# Patient Record
Sex: Female | Born: 1985 | Race: Black or African American | Hispanic: No | Marital: Single | Smoking: Never smoker
Health system: Southern US, Community
[De-identification: ages and names within clinical notes are randomized; demographics above are authoritative.]

## PROBLEM LIST (undated history)

## (undated) DIAGNOSIS — R7303 Prediabetes: Secondary | ICD-10-CM

## (undated) DIAGNOSIS — F419 Anxiety disorder, unspecified: Secondary | ICD-10-CM

## (undated) DIAGNOSIS — F319 Bipolar disorder, unspecified: Secondary | ICD-10-CM

## (undated) HISTORY — DX: Prediabetes: R73.03

---
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
## 2014-04-26 ENCOUNTER — Encounter: Payer: Federal, State, Local not specified - PPO | Attending: Physician Assistant | Admitting: Dietician

## 2014-04-26 ENCOUNTER — Encounter: Payer: Self-pay | Admitting: Dietician

## 2014-04-26 DIAGNOSIS — Z6841 Body Mass Index (BMI) 40.0 and over, adult: Secondary | ICD-10-CM | POA: Diagnosis not present

## 2014-04-26 NOTE — Progress Notes (Signed)
  Medical Nutrition Therapy:  Appt start time: 0800 end time:  0845.   Assessment:  Primary concerns today: Hayley Romero is here today she is on medication for bipolar disorder. Sees a psychiatrist and recently moved to [place]. Has gained 70 lbs since 2010 since the medications caused her to gain weight. Also has elevated blood sugar (Hgb A1c of 6.1-6.2% per patient). Got a trainer in December in 2015 and has lost about 7 lbs since then. Started cooking more and eating out less though still has cravings for sugar and salt, though tries to not keep it at home. Also craves bread.  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 Does not skip meals though "eats all day long". Not sure if she is eating because she is hungry. Eats out about 3-6 x week.   Using MyFitness Pal.  Feels like she might be eating more than she needs but she is hungry. Is a fast eater. Eats meals at her table at home. Feels tired mid morning but she is not sure why.  Preferred Learning Style:   No preference indicated   Learning Readiness:   Ready  MEDICATIONS: see list    DIETARY INTAKE:  Usual eating pattern includes 3 meals and 2-3 snacks per day.  Avoided foods include a lot of vegetables  24-hr recall:  B ( AM): grits with cheese, bacon/sausage, eggs with applesauce or oatmeal with eggs with 1 cup orange juice Snk ( AM): granola and yogurt L ( PM): pasta casserole with spinach and tomato sauce  Snk ( PM): [employer] peanut butter bar or oatmeal  D ( PM): homemade pizza (small) or hot dog or fajita (chicken and peppers and onions) Snk ( PM): popcorn Beverages: water, 2 cups juice at home  Usual physical activity: 1-3 x week 30-60 minutes with trainer cardio and weights  Estimated energy needs: 2000 calories 225 g carbohydrates 150 g protein 56 g fat  Progress Towards Goal(s):  In progress.   Nutritional Diagnosis:  [place]-3.3 Overweight/obesity As related to hx of large portion sizes and frequent  snacking.  As evidenced by BMI of 40.8.    Intervention:  Nutrition counseling provided. Plan: Add vegetable to lunch and dinner. Drink water instead of orange juice in the morning. Have protein with carbs for snacks (see list). Don't eat and work at the same time. If you hungry, take a break. Use small plates to help with portion sizes.  Take 20 minutes to eat meals. Chew 20-30 times per bite. Put your fork down. If you are still hungry after 20 minutes, have seconds.  Continue exercising about 3 x week.  Teaching Method Utilized:  Visual Auditory Hands on  Handouts given during visit include:  MyPlate Handout  Meal card  15 g CHO Snacks  Barriers to learning/adherence to lifestyle change: medications increase hunger  Demonstrated degree of understanding via:  Teach Back   Monitoring/Evaluation:  Dietary intake, exercise, and body weight prn.

## 2014-04-26 NOTE — Patient Instructions (Signed)
Add vegetable to lunch and dinner. Drink water instead of orange juice in the morning. Have protein with carbs for snacks (see list). Don't eat and work at the same time. If you hungry, take a break. Use small plates to help with portion sizes.  Take 20 minutes to eat meals. Chew 20-30 times per bite. Put your fork down. If you are still hungry after 20 minutes, have seconds.  Continue exercising about 3 x week.

## 2014-08-28 ENCOUNTER — Encounter: Payer: Self-pay | Admitting: Dietician

## 2014-08-28 ENCOUNTER — Encounter: Payer: Federal, State, Local not specified - PPO | Attending: Physician Assistant | Admitting: Dietician

## 2014-08-28 DIAGNOSIS — Z713 Dietary counseling and surveillance: Secondary | ICD-10-CM | POA: Insufficient documentation

## 2014-08-28 DIAGNOSIS — Z6841 Body Mass Index (BMI) 40.0 and over, adult: Secondary | ICD-10-CM | POA: Insufficient documentation

## 2014-08-28 NOTE — Patient Instructions (Addendum)
Aim to fill half of your plate with vegetables for lunch and dinner. Have protein with carbs for snacks (see list). Don't eat and work at the same time when you can.  Use small plates to help with portion sizes.  Plan to walk the track about 3 x week. Make sure to drink at least 64 oz of fluid in each day.  Portion out pecans (size of palm of your hand) to have with snacks.  Have [facility] Protein bar when you are craving sweets.  Try not to have tempting food around the house.  Do meal planning on weekend.

## 2014-08-28 NOTE — Progress Notes (Signed)
  Medical Nutrition Therapy:  Appt start time: 0415 end time:  0445.   Assessment:  Primary concerns today: Hayley Romero returns with a 5 lb weight gain. Had an allergic reaction to Topamax and regained some weight and has had more cravings since going on off that. Stopped trying to eat healthy after that. Had another Hgb A1c test and not sure of the results. No longer working out with trainer since it was too expensive. Still cooking a lot at home. Not craving bread as much but craves sugar. Still using MyFitness Pal.  No longer drinking juice for the past month.     Wt Readings from Last 3 Encounters:  08/28/14 213 lb 3.2 oz (96.707 kg)  04/26/14 208 lb 12.8 oz (94.711 kg)   Ht Readings from Last 3 Encounters:  08/28/14 5' (1.524 m)  04/26/14 5' (1.524 m)   Body mass index is 41.64 kg/(m^2). @BMIFA @ Normalized weight-for-age data available only for age 56 to 20 years. Normalized stature-for-age data available only for age 56 to 20 years.   Preferred Learning Style:   No preference indicated   Learning Readiness:   Ready  MEDICATIONS: see list    DIETARY INTAKE:  Usual eating pattern includes 3 meals and 2-3 snacks per day.  Avoided foods include a lot of vegetables  24-hr recall:  B ( AM): meat, eggs, and maybe bread Snk ( AM): pecans and yogurt L ( PM): pasta casserole with spinach and tomato sauce or chicken and spaghetti and applesauce Snk ( PM): chips  D ( PM): frozen flounder sandwich or hot dog  Snk ( PM): pecans with grapes, pineapple, and yogurt Beverages: water, herbal green tea with oranges/pineapples   Usual physical activity: none  Estimated energy needs: 2000 calories 225 g carbohydrates 150 g protein 56 g fat  Progress Towards Goal(s):  In progress.   Nutritional Diagnosis:  [place]-3.3 Overweight/obesity As related to hx of large portion sizes and frequent snacking.  As evidenced by BMI of 40.8.    Intervention:  Nutrition counseling  provided. Plan: Aim to fill half of your plate with vegetables for lunch and dinner. Have protein with carbs for snacks (see list). Don't eat and work at the same time when you can.  Use small plates to help with portion sizes.  Plan to walk the track about 3 x week. Make sure to drink at least 64 oz of fluid in each day.  Portion out pecans (size of palm of your hand) to have with snacks.  Have [facility] Protein bar when you are craving sweets.  Try not to have tempting food around the house.  Do meal planning on weekend.   Teaching Method Utilized:  Visual Auditory Hands on   Barriers to learning/adherence to lifestyle change: medications increase hunger  Demonstrated degree of understanding via:  Teach Back   Monitoring/Evaluation:  Dietary intake, exercise, and body weight prn.

## 2014-08-31 ENCOUNTER — Emergency Department (HOSPITAL_COMMUNITY): Payer: Federal, State, Local not specified - PPO

## 2014-08-31 ENCOUNTER — Encounter (HOSPITAL_COMMUNITY): Payer: Self-pay | Admitting: *Deleted

## 2014-08-31 ENCOUNTER — Emergency Department (HOSPITAL_COMMUNITY)
Admission: EM | Admit: 2014-08-31 | Discharge: 2014-08-31 | Disposition: A | Discharge status: Home / Self Care | Payer: Federal, State, Local not specified - PPO | Attending: Emergency Medicine | Admitting: Emergency Medicine

## 2014-08-31 DIAGNOSIS — Z79899 Other long term (current) drug therapy: Secondary | ICD-10-CM | POA: Diagnosis not present

## 2014-08-31 DIAGNOSIS — S8391XA Sprain of unspecified site of right knee, initial encounter: Secondary | ICD-10-CM | POA: Insufficient documentation

## 2014-08-31 DIAGNOSIS — Y92192 Bathroom in other specified residential institution as the place of occurrence of the external cause: Secondary | ICD-10-CM | POA: Diagnosis not present

## 2014-08-31 DIAGNOSIS — S8991XA Unspecified injury of right lower leg, initial encounter: Secondary | ICD-10-CM | POA: Diagnosis present

## 2014-08-31 DIAGNOSIS — Y9389 Activity, other specified: Secondary | ICD-10-CM | POA: Insufficient documentation

## 2014-08-31 DIAGNOSIS — X58XXXA Exposure to other specified factors, initial encounter: Secondary | ICD-10-CM | POA: Insufficient documentation

## 2014-08-31 DIAGNOSIS — Y998 Other external cause status: Secondary | ICD-10-CM | POA: Diagnosis not present

## 2014-08-31 MED ORDER — IBUPROFEN 600 MG PO TABS: 600.0000 mg | ORAL_TABLET | Freq: Four times a day (QID) | ORAL | Status: DC | PRN

## 2014-08-31 MED ORDER — HYDROCODONE-ACETAMINOPHEN 5-325 MG PO TABS
1.0000 | ORAL_TABLET | Freq: Once | ORAL | Status: DC
  Filled 2014-08-31: qty 1

## 2014-08-31 MED ORDER — TRAMADOL HCL 50 MG PO TABS: 50.0000 mg | ORAL_TABLET | Freq: Four times a day (QID) | ORAL | Status: DC | PRN

## 2014-08-31 MED ORDER — IBUPROFEN 200 MG PO TABS
600.0000 mg | ORAL_TABLET | Freq: Once | ORAL | Status: AC
  Administered 2014-08-31: 600 mg via ORAL
  Filled 2014-08-31: qty 3

## 2014-08-31 NOTE — ED Notes (Signed)
Pt transported from home by EMS after rolling over in bed 10 min ago and feeling pain to R knee. No swelling or deformity noted by EMS.

## 2014-08-31 NOTE — ED Notes (Signed)
Pt ambulating independently w/ steady gait on d/c in no acute distress, A&Ox4. D/c instructions reviewed w/ pt and family - pt and family deny any further questions or concerns at present. Rx given x2  

## 2014-08-31 NOTE — ED Provider Notes (Signed)
CSN: [id number]     Arrival date & time 08/31/14  0441 History   First MD Initiated Contact with Patient 08/31/14 [phone]     Chief Complaint  Patient presents with  . Knee Pain     (Consider location/radiation/quality/duration/timing/severity/associated sxs/prior Treatment) HPI Comments: Pt comes in with cc of R knee pain. Pt has hx of knee injury in the past when she was getting self defense training. She was moving around well since that episode. Pt was laying in the bed, and was trying to turn - she heard a "pop" and started having severe pain. The incident happened at 3 am. Pt has been hobbling since then.   Patient is a 29 y.o. female presenting with knee pain. The history is provided by the patient.  Knee Pain   History reviewed. No pertinent past medical history. History reviewed. No pertinent past surgical history. History reviewed. No pertinent family history. History  Substance Use Topics  . Smoking status: Never Smoker   . Smokeless tobacco: Not on file  . Alcohol Use: No   OB History    No data available     Review of Systems  Musculoskeletal: Positive for myalgias and arthralgias.  Skin: Positive for rash.      Allergies  Quetiapine; Risperidone and related; Terbinafine and related; and Topamax  Home Medications   Prior to Admission medications   Medication Sig Start Date End Date Taking? Authorizing Provider  clonazePAM (KLONOPIN) 1 MG tablet Take 1 mg by mouth at bedtime.    Yes Historical Provider, MD  diphenhydrAMINE (BENADRYL) 25 MG tablet Take 50 mg by mouth at bedtime as needed (for sleep).   Yes Historical Provider, MD  lamoTRIgine (LAMICTAL) 100 MG tablet Take 200 mg by mouth at bedtime. 08/10/14  Yes Historical Provider, MD  ziprasidone (GEODON) 40 MG capsule Take 40 mg by mouth at bedtime and may repeat dose one time if needed. 08/24/14  Yes Historical Provider, MD  ziprasidone (GEODON) 80 MG capsule Take 80 mg by mouth 2 (two) times daily with a  meal.   Yes Historical Provider, MD  ibuprofen (ADVIL,MOTRIN) 600 MG tablet Take 1 tablet (600 mg total) by mouth every 6 (six) hours as needed. 08/31/14   Derwood Kaplan, MD  traMADol (ULTRAM) 50 MG tablet Take 1 tablet (50 mg total) by mouth every 6 (six) hours as needed. 08/31/14   Aaditya Letizia, MD   BP 129/66 mmHg  Pulse 87  Temp(Src) 98.4 F (36.9 C) (Oral)  Resp 25  SpO2 99% Physical Exam  Constitutional: She is oriented to person, place, and time. She appears well-developed.  HENT:  Head: Normocephalic and atraumatic.  Eyes: EOM are normal.  Neck: Normal range of motion. Neck supple.  Cardiovascular: Normal rate.   Pulmonary/Chest: Effort normal.  Abdominal: Bowel sounds are normal.  Musculoskeletal:  Pt has R knee swelling, infrapatellar. No laxiry on valgus or varus stress. Neg anterior and posterior drawers.    Neurological: She is alert and oriented to person, place, and time.  Skin: Skin is warm and dry.  Nursing note and vitals reviewed.   ED Course  Procedures (including critical care time) Labs Review Labs Reviewed - No data to display  Imaging Review Dg Knee Complete 4 Views Right  08/31/2014   CLINICAL DATA:  Pain in the right knee after rolling over in bed. Initial encounter.  EXAM: RIGHT KNEE - COMPLETE 4+ VIEW  COMPARISON:  None.  FINDINGS: The patella appears high and lateral; the  patellar tendon shadow is intact. Seating would be better established on sunrise view, if further evaluation is needed. There is no acute fracture or dislocation.  IMPRESSION: 1. No acute fracture dislocation. 2. Mildly high and lateral patella position which may reflect tracking disorder.   Electronically Signed   By: Marnee SpringJonathon  Watts M.D.   On: 08/31/2014 05:40     EKG Interpretation None      MDM   Final diagnoses:  Right knee sprain, initial encounter    Pt comes in with cc of knee pain. Knee pain started when she was turning in her bed, and she heard a pop. There  is mild infrapatellar swelling. Will tx conservatively with RICE and partial weight bearing, as the xrays are neg and there is no laxity of the knee on my exam.     Derwood KaplanAnkit Arriah Wadle, MD 08/31/14 [phone]

## 2014-08-31 NOTE — Discharge Instructions (Signed)
We are going to treat you knee as knee sprain. Apply weight as tolerated. Use crutches and knee immobilizer for support. Use RICE therapy as below.   Knee Sprain A knee sprain is a tear in one of the strong, fibrous tissues that connect the bones (ligaments) in your knee. The severity of the sprain depends on how much of the ligament is torn. The tear can be either partial or complete. CAUSES  Often, sprains are a result of a fall or injury. The force of the impact causes the fibers of your ligament to stretch too much. This excess tension causes the fibers of your ligament to tear. SIGNS AND SYMPTOMS  You may have some loss of motion in your knee. Other symptoms include:  Bruising.  Pain in the knee area.  Tenderness of the knee to the touch.  Swelling. DIAGNOSIS  To diagnose a knee sprain, your health care provider will physically examine your knee. Your health care provider may also suggest an X-ray exam of your knee to make sure no bones are broken. TREATMENT  If your ligament is only partially torn, treatment usually involves keeping the knee in a fixed position (immobilization) or bracing your knee for activities that require movement for several weeks. To do this, your health care provider will apply a bandage, cast, or splint to keep your knee from moving and to support your knee during movement until it heals. For a partially torn ligament, the healing process usually takes 4-6 weeks. If your ligament is completely torn, depending on which ligament it is, you may need surgery to reconnect the ligament to the bone or reconstruct it. After surgery, a cast or splint may be applied and will need to stay on your knee for 4-6 weeks while your ligament heals. HOME CARE INSTRUCTIONS  Keep your injured knee elevated to decrease swelling.  To ease pain and swelling, apply ice to the injured area:  Put ice in a plastic bag.  Place a towel between your skin and the bag.  Leave the ice  on for 20 minutes, 2-3 times a day.  Only take medicine for pain as directed by your health care provider.  Do not leave your knee unprotected until pain and stiffness go away (usually 4-6 weeks).  If you have a cast or splint, do not allow it to get wet. If you have been instructed not to remove it, cover it with a plastic bag when you shower or bathe. Do not swim.  Your health care provider may suggest exercises for you to do during your recovery to prevent or limit permanent weakness and stiffness. SEEK IMMEDIATE MEDICAL CARE IF:  Your cast or splint becomes damaged.  Your pain becomes worse.  You have significant pain, swelling, or numbness below the cast or splint. MAKE SURE YOU:  Understand these instructions.  Will watch your condition.  Will get help right away if you are not doing well or get worse. Document Released: 02/07/2005 Document Revised: 11/28/2012 Document Reviewed: 09/19/2012 [facility] Patient Information 2015 [place], [place]. This information is not intended to replace advice given to you by your health care provider. Make sure you discuss any questions you have with your health care provider. RICE: Routine Care for Injuries The routine care of many injuries includes Rest, Ice, Compression, and Elevation (RICE). HOME CARE INSTRUCTIONS  Rest is needed to allow your body to heal. Routine activities can usually be resumed when comfortable. Injured tendons and bones can take up to 6 weeks  to heal. Tendons are the cord-like structures that attach muscle to bone.  Ice following an injury helps keep the swelling down and reduces pain.  Put ice in a plastic bag.  Place a towel between your skin and the bag.  Leave the ice on for 15-20 minutes, 3-4 times a day, or as directed by your health care provider. Do this while awake, for the first 24 to 48 hours. After that, continue as directed by your caregiver.  Compression helps keep swelling down. It also gives support  and helps with discomfort. If an elastic bandage has been applied, it should be removed and reapplied every 3 to 4 hours. It should not be applied tightly, but firmly enough to keep swelling down. Watch fingers or toes for swelling, bluish discoloration, coldness, numbness, or excessive pain. If any of these problems occur, remove the bandage and reapply loosely. Contact your caregiver if these problems continue.  Elevation helps reduce swelling and decreases pain. With extremities, such as the arms, hands, legs, and feet, the injured area should be placed near or above the level of the heart, if possible. SEEK IMMEDIATE MEDICAL CARE IF:  You have persistent pain and swelling.  You develop redness, numbness, or unexpected weakness.  Your symptoms are getting worse rather than improving after several days. These symptoms may indicate that further evaluation or further X-rays are needed. Sometimes, X-rays may not show a small broken bone (fracture) until 1 week or 10 days later. Make a follow-up appointment with your caregiver. Ask when your X-ray results will be ready. Make sure you get your X-ray results. Document Released: 05/22/2000 Document Revised: 02/12/2013 Document Reviewed: 07/09/2010 [facility] Patient Information 2015 [place]. This information is not intended to replace advice given to you by your health care provider. Make sure you discuss any questions you have with your health care provider.

## 2014-08-31 NOTE — ED Notes (Signed)
Bed: [id number] Expected date:  Expected time:  Means of arrival:  Comments: 49F rolled over knee pain

## 2014-10-01 ENCOUNTER — Ambulatory Visit: Payer: Federal, State, Local not specified - PPO | Admitting: Dietician

## 2015-09-10 ENCOUNTER — Other Ambulatory Visit: Payer: Self-pay | Admitting: Family Medicine

## 2015-09-10 DIAGNOSIS — E049 Nontoxic goiter, unspecified: Secondary | ICD-10-CM

## 2015-09-15 ENCOUNTER — Ambulatory Visit
Admission: RE | Admit: 2015-09-15 | Discharge: 2015-09-15 | Disposition: A | Discharge status: Home / Self Care | Payer: Medicaid Other | Source: Ambulatory Visit | Attending: Family Medicine | Admitting: Family Medicine

## 2015-09-15 ENCOUNTER — Other Ambulatory Visit: Payer: Federal, State, Local not specified - PPO

## 2015-09-15 DIAGNOSIS — E049 Nontoxic goiter, unspecified: Secondary | ICD-10-CM

## 2015-10-29 ENCOUNTER — Emergency Department (HOSPITAL_COMMUNITY)
Admission: EM | Admit: 2015-10-29 | Discharge: 2015-10-29 | Discharge status: Against Medical Advice | Payer: Medicaid Other | Attending: Emergency Medicine | Admitting: Emergency Medicine

## 2015-10-29 ENCOUNTER — Encounter (HOSPITAL_COMMUNITY): Payer: Self-pay | Admitting: Nurse Practitioner

## 2015-10-29 ENCOUNTER — Emergency Department (HOSPITAL_COMMUNITY): Payer: Medicaid Other

## 2015-10-29 DIAGNOSIS — Z79899 Other long term (current) drug therapy: Secondary | ICD-10-CM | POA: Insufficient documentation

## 2015-10-29 DIAGNOSIS — R52 Pain, unspecified: Secondary | ICD-10-CM

## 2015-10-29 DIAGNOSIS — R1032 Left lower quadrant pain: Secondary | ICD-10-CM | POA: Diagnosis not present

## 2015-10-29 DIAGNOSIS — Z791 Long term (current) use of non-steroidal anti-inflammatories (NSAID): Secondary | ICD-10-CM | POA: Insufficient documentation

## 2015-10-29 DIAGNOSIS — R109 Unspecified abdominal pain: Secondary | ICD-10-CM

## 2015-10-29 LAB — URINALYSIS, ROUTINE W REFLEX MICROSCOPIC
Bilirubin Urine: NEGATIVE
Glucose, UA: NEGATIVE mg/dL
Ketones, ur: NEGATIVE mg/dL
Nitrite: NEGATIVE
Protein, ur: 30 mg/dL — AB
Specific Gravity, Urine: 1.033 — ABNORMAL HIGH (ref 1.005–1.030)
pH: 6.5 (ref 5.0–8.0)

## 2015-10-29 LAB — URINE MICROSCOPIC-ADD ON

## 2015-10-29 LAB — PREGNANCY, URINE: Preg Test, Ur: NEGATIVE

## 2015-10-29 MED ORDER — ONDANSETRON 4 MG PO TBDP
4.0000 mg | ORAL_TABLET | Freq: Once | ORAL | Status: DC
  Filled 2015-10-29: qty 1

## 2015-10-29 MED ORDER — CEPHALEXIN 500 MG PO CAPS
500.0000 mg | ORAL_CAPSULE | Freq: Once | ORAL | Status: DC
  Filled 2015-10-29: qty 1

## 2015-10-29 MED ORDER — OXYCODONE-ACETAMINOPHEN 5-325 MG PO TABS
2.0000 | ORAL_TABLET | Freq: Once | ORAL | Status: DC
  Filled 2015-10-29: qty 2

## 2015-10-29 NOTE — ED Notes (Addendum)
Went to give pt her medications. Pt said she wanted to leave and did not want medications. MD made aware.

## 2015-10-29 NOTE — ED Notes (Signed)
Patient left AMA. Told patient that dr had put in orders for her she didn't care was very rude and wanted to leave "stating this should take this long that she had an apt with her primary care dr and she would just see him"

## 2015-10-29 NOTE — ED Triage Notes (Signed)
Had a spell two weeks ago of diarrhea and vomiting blood but it resolved.

## 2015-10-29 NOTE — ED Provider Notes (Signed)
[facility]-EMERGENCY DEPT Provider Note   CSN: [id number] Arrival date & time: 10/29/15  1308    By signing my name below, I, [name], attest that this documentation has been prepared under the direction and in the presence of Raeford Razor, MD. Electronically Signed: [name], Neurosurgeon. 10/29/15. 10:07 AM.  History   Chief Complaint Chief Complaint  Patient presents with  . Abdominal Pain  . Nausea    The history is provided by the patient. No language interpreter was used.     HPI Comments: Hayley Romero is a 30 y.o. female who presents to the Emergency Department complaining of 1 month of intermittent, non-radiating bilateral abdominal pain (left > right) that worsened and became constant last night. She states the pain is aggravated by certain movements. She has taken ibuprofen without relief. She reports having a few episodes of vomiting a few weeks ago with streaks of blood present. She takes Zantac for history of GERD. She denies dysuria, marked frequency, vaginal discharge, vaginal bleeding. She denies history of abdominal surgeries. She denies history of ovarian cysts.    History reviewed. No pertinent past medical history.  There are no active problems to display for this patient.   History reviewed. No pertinent surgical history.  OB History    No data available       Home Medications    Prior to Admission medications   Medication Sig Start Date End Date Taking? Authorizing Provider  clonazePAM (KLONOPIN) 1 MG tablet Take 1 mg by mouth at bedtime.     Historical Provider, MD  diphenhydrAMINE (BENADRYL) 25 MG tablet Take 50 mg by mouth at bedtime as needed (for sleep).    Historical Provider, MD  ibuprofen (ADVIL,MOTRIN) 600 MG tablet Take 1 tablet (600 mg total) by mouth every 6 (six) hours as needed. 08/31/14   Derwood Kaplan, MD  lamoTRIgine (LAMICTAL) 100 MG tablet Take 200 mg by mouth at bedtime. 08/10/14   Historical Provider, MD  traMADol (ULTRAM) 50 MG  tablet Take 1 tablet (50 mg total) by mouth every 6 (six) hours as needed. 08/31/14   Derwood Kaplan, MD  ziprasidone (GEODON) 40 MG capsule Take 40 mg by mouth at bedtime and may repeat dose one time if needed. 08/24/14   Historical Provider, MD  ziprasidone (GEODON) 80 MG capsule Take 80 mg by mouth 2 (two) times daily with a meal.    Historical Provider, MD    Family History History reviewed. No pertinent family history.  Social History Social History  Substance Use Topics  . Smoking status: Never Smoker  . Smokeless tobacco: Never Used  . Alcohol use No     Allergies   Quetiapine; Risperidone and related; Terbinafine and related; and Topamax [topiramate]   Review of Systems Review of Systems  Gastrointestinal: Positive for abdominal pain and vomiting (resolved).  Genitourinary: Negative for dysuria, frequency, vaginal bleeding and vaginal discharge.  All other systems reviewed and are negative.   Physical Exam Updated Vital Signs BP 142/95 (BP Location: Left Arm)   Pulse 94   Temp 98.6 F (37 C) (Oral)   Resp 17   Ht 5' (1.524 m)   Wt 222 lb (100.7 kg)   LMP 10/22/2015   SpO2 100%   BMI 43.36 kg/m   Physical Exam  Constitutional: She is oriented to person, place, and time. She appears well-developed and well-nourished. No distress.  HENT:  Head: Normocephalic and atraumatic.  Eyes: EOM are normal.  Neck: Normal range of motion.  Cardiovascular: Normal rate, regular rhythm and normal heart sounds.   Pulmonary/Chest: Effort normal and breath sounds normal.  Abdominal: Soft. She exhibits no distension. There is tenderness (LLQ). There is guarding (voluntary).  Musculoskeletal: Normal range of motion.  Neurological: She is alert and oriented to person, place, and time.  Skin: Skin is warm and dry.  Psychiatric: She has a normal mood and affect. Judgment normal.  Nursing note and vitals reviewed.    ED Treatments / Results  DIAGNOSTIC STUDIES: Oxygen  Saturation is 100% on RA, normal by my interpretation.    COORDINATION OF CARE: 10:07 AM Discussed treatment plan with pt at bedside and pt agreed to plan.   Labs (all labs ordered are listed, but only abnormal results are displayed) Labs Reviewed  URINE CULTURE - Abnormal; Notable for the following:       Result Value   Culture MULTIPLE SPECIES PRESENT, SUGGEST RECOLLECTION (*)    All other components within normal limits  URINALYSIS, ROUTINE W REFLEX MICROSCOPIC (NOT AT [facility]) - Abnormal; Notable for the following:    APPearance CLOUDY (*)    Specific Gravity, Urine 1.033 (*)    Hgb urine dipstick MODERATE (*)    Protein, ur 30 (*)    Leukocytes, UA TRACE (*)    All other components within normal limits  URINE MICROSCOPIC-ADD ON - Abnormal; Notable for the following:    Squamous Epithelial / LPF 0-5 (*)    Bacteria, UA MANY (*)    All other components within normal limits  PREGNANCY, URINE    EKG  EKG Interpretation None       Radiology No results found.  Procedures Procedures (including critical care time)  Medications Ordered in ED Medications - No data to display   Initial Impression / Assessment and Plan / ED Course  I have reviewed the triage vital signs and the nursing notes.  Pertinent labs & imaging results that were available during my care of the patient were reviewed by me and considered in my medical decision making (see chart for details).  Clinical Course   29yF with LLQ pain. Some TTP on exam. She declined pelvic exam and then subsequently imaging. She apparently was upset with waiting and she left before I was even aware she was trying to.    Final Clinical Impressions(s) / ED Diagnoses     New Prescriptions New Prescriptions   No medications on file    I personally preformed the services scribed in my presence. The recorded information has been reviewed is accurate. Raeford RazorStephen Namita Yearwood, MD.    Raeford RazorStephen Madelynn Malson, MD 11/04/15 [phone]

## 2015-10-30 LAB — URINE CULTURE

## 2015-11-16 ENCOUNTER — Other Ambulatory Visit: Payer: Self-pay | Admitting: Family Medicine

## 2015-11-16 DIAGNOSIS — N939 Abnormal uterine and vaginal bleeding, unspecified: Secondary | ICD-10-CM

## 2015-11-23 ENCOUNTER — Ambulatory Visit
Admission: RE | Admit: 2015-11-23 | Discharge: 2015-11-23 | Disposition: A | Discharge status: Home / Self Care | Payer: Medicaid Other | Source: Ambulatory Visit | Attending: Family Medicine | Admitting: Family Medicine

## 2015-11-23 DIAGNOSIS — N939 Abnormal uterine and vaginal bleeding, unspecified: Secondary | ICD-10-CM

## 2015-12-21 ENCOUNTER — Ambulatory Visit: Payer: Medicaid Other | Admitting: Dietician

## 2015-12-22 ENCOUNTER — Encounter: Payer: Self-pay | Admitting: Dietician

## 2015-12-22 ENCOUNTER — Encounter: Payer: Medicaid Other | Attending: Family Medicine | Admitting: Dietician

## 2015-12-22 DIAGNOSIS — E663 Overweight: Secondary | ICD-10-CM | POA: Diagnosis not present

## 2015-12-22 DIAGNOSIS — E119 Type 2 diabetes mellitus without complications: Secondary | ICD-10-CM | POA: Insufficient documentation

## 2015-12-22 DIAGNOSIS — R7303 Prediabetes: Secondary | ICD-10-CM

## 2015-12-22 DIAGNOSIS — Z713 Dietary counseling and surveillance: Secondary | ICD-10-CM | POA: Diagnosis not present

## 2015-12-22 NOTE — Patient Instructions (Signed)
Continue the great changes that you have made.  You have made some great changes! Drink beverages without carbohydrates.  The infused tea and water are great choices.  Limit orange juice to 1/2 cup. Consider decreasing the amount of added sugar in recipes.  Be as active as possible.  Aim for 30 minutes most days. Consider reading food labels for carbohydrates. Aim for small portions of protein with each meal (beans, nuts/seeds, eggs, gimmie lean or other plant based protein). 1/2 your plate should be non starchy vegetables

## 2015-12-22 NOTE — Progress Notes (Signed)
Medical Nutrition Therapy:  Appt start time: 1400 end time:  1530.   Assessment:  Primary concerns today: Patient is here alone.  She wants to make sure that she is doing the right thing to control her prediabetes as she has made many changes.  Hx includes Obesity, and bipolar disorder. She is now getting 8-9 hours of sleep daily.  Weight today 214 lbs down from 223 lbs in the past month.  She complains of an ovarian cist, increased menstrual bleeding, and her stomach hurts each time she eats with immediate formed BM and urination after meals.  These things are causing her not to want to eat as much but she has had drastic changes in the past 8 weeks.  She used to eat fast food "every day and all day long" and used to be a heavy meat eater.  She now eats mostly vegetarian.  She was tired all of the time but now is "not so sluggish" since the diet changes.   Weight hx: Lowest adult weight 209 lbs (2016) Usual weight 212-213 lbs Highest weight 223 lbs about 8 weeks ago Today 214 lbs  Patient lives with her sister.  They share the shopping but she does all of the cooking.  She is currently unemployed ("due to health issues").  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  Preferred Learning Style:   No preference indicated   Learning Readiness:   Ready  Change in progress  MEDICATIONS: see list to include Geoden   DIETARY INTAKE:  24-hr recall:  B (9 AM): grits and eggs and occasional homemade applesauce with brown sugar, white sugar, and honey and occasional brioche bun and rare Malaw[place]urkey bacon. Snk ( AM): homemade granola (flaxseeds, almonds, walnuts, raisins, oats, honey, butter)  L (12-3PM):  Spinach salad with croutons and homemade dressing and may have homemade veggie burger on bun or [employer] wrap OR skips Snk ( PM): granola OR nothing OR smoothie (camemile tea, spinach, pineapple, berries, strawberries) D (6 PM): Repeat of lunch  OR snack OR pasta or soup  Snk ( PM): homemade granola and almond milk Beverages: water, OJ, herbal tea (lemon or orange slice), occasional almond milk  Usual physical activity: walking 1 mile daily but increased fatigue afterward)      Estimated energy needs: 1600 calories 180 g carbohydrates 100 g protein 53 g fat  Progress Towards Goal(s):  In progress.   Nutritional Diagnosis:  NB-1.1 Food and nutrition-related knowledge deficit As related to nutrition for prediabetes.  As evidenced by patient report.    Intervention:  Nutrition counseling/education related to healthy eating for prediabetes on mostly vegetarian diet.  Continue the great changes that you have made.  You have made some great changes! Drink beverages without carbohydrates.  The infused tea and water are great choices.  Limit orange juice to 1/2 cup. Consider decreasing the amount of added sugar in recipes.  Be as active as possible.  Aim for 30 minutes most days. Consider reading food labels for carbohydrates. Aim for small portions of protein with each meal (beans, nuts/seeds, eggs, gimmie lean or other plant based protein). 1/2 your plate should be non starchy vegetables   Teaching Method Utilized:  Visual Auditory Hands on  Handouts given during visit include:  My plate  Meal plan card  Label reading  Snack list  Barriers to learning/adherence to lifestyle change: none  Demonstrated degree of understanding via:  Teach Back  Monitoring/Evaluation:  Dietary intake, exercise, label reading, and body weight prn.

## 2016-01-27 ENCOUNTER — Emergency Department (HOSPITAL_COMMUNITY)
Admission: EM | Admit: 2016-01-27 | Discharge: 2016-01-27 | Disposition: A | Discharge status: Home / Self Care | Payer: Medicaid Other | Attending: Emergency Medicine | Admitting: Emergency Medicine

## 2016-01-27 ENCOUNTER — Emergency Department (HOSPITAL_COMMUNITY): Payer: Medicaid Other

## 2016-01-27 DIAGNOSIS — Z79899 Other long term (current) drug therapy: Secondary | ICD-10-CM | POA: Diagnosis not present

## 2016-01-27 DIAGNOSIS — M25561 Pain in right knee: Secondary | ICD-10-CM | POA: Diagnosis not present

## 2016-01-27 MED ORDER — HYDROCODONE-ACETAMINOPHEN 5-325 MG PO TABS
1.0000 | ORAL_TABLET | Freq: Once | ORAL | Status: AC
  Administered 2016-01-27: 1 via ORAL
  Filled 2016-01-27: qty 1

## 2016-01-27 MED ORDER — HYDROCODONE-ACETAMINOPHEN 5-325 MG PO TABS: 1.0000 | ORAL_TABLET | ORAL | 0 refills | Status: DC | PRN

## 2016-01-27 MED ORDER — MELOXICAM 7.5 MG PO TABS: 7.5000 mg | ORAL_TABLET | Freq: Every day | ORAL | 0 refills | Status: DC | PRN

## 2016-01-27 NOTE — Discharge Instructions (Signed)
Read the information below.  Use the prescribed medication as directed.  Please discuss all new medications with your pharmacist.  Do not take additional tylenol while taking the prescribed pain medication to avoid overdose.  You may return to the Emergency Department at any time for worsening condition or any new symptoms that concern you.     If you develop uncontrolled pain, weakness or numbness of the extremity, severe discoloration of the skin, or you are unable to move your leg or walk, return to the ER for a recheck.    °

## 2016-01-27 NOTE — ED Triage Notes (Signed)
Pt reports dx of torn right ACL and knee dislocation. Pt was cleaning bathroom today when she felt her right knee "pop". Pt presents with edema and pain 10/10 to knee.

## 2016-01-27 NOTE — ED Provider Notes (Signed)
[facility]-EMERGENCY DEPT Provider Note   CSN: [id number] Arrival date & time: 01/27/16  2002     History   Chief Complaint Chief Complaint  Patient presents with  . Knee Pain    HPI Hayley Romero is a 30 y.o. female.  HPI   Patient with history of remote right knee ACL and meniscus injuries and chronic right knee pain presents with sudden pop in the right knee and increase in pain after attempting to stand up from a kneeling position earlier today.  States she had had increased pain earlier in the week after she started walking for exercise, then decreased this activity.  Since she attempted to stand today and felt the pop she has had pain diffusely throughout knee and radiating into her upper and lower leg.  The pain is constant, worse with any movement or ambulation.   Denies any other injury. Denies weakness or numbness of the foot.    States she was [facility] whose name she cannot remember.  Had MRI showing above findings (2016) and was told she needs "total knee reconstruction."    Past Medical History:  Diagnosis Date  . Prediabetes     There are no active problems to display for this patient.   No past surgical history on file.  OB History    No data available       Home Medications    Prior to Admission medications   Medication Sig Start Date End Date Taking? Authorizing Provider  clonazePAM (KLONOPIN) 1 MG tablet Take 1 mg by mouth at bedtime.     Historical Provider, MD  diphenhydrAMINE (BENADRYL) 25 MG tablet Take 50 mg by mouth at bedtime as needed (for sleep).    Historical Provider, MD  HYDROcodone-acetaminophen (NORCO/VICODIN) 5-325 MG tablet Take 1 tablet by mouth every 4 (four) hours as needed for moderate pain or severe pain. 01/27/16   Trixie DredgeEmily Antoninette Lerner, PA-C  ibuprofen (ADVIL,MOTRIN) 200 MG tablet Take 400 mg by mouth every 6 (six) hours as needed for headache, mild pain or moderate pain.    Historical Provider, MD    lamoTRIgine (LAMICTAL) 100 MG tablet Take 200 mg by mouth at bedtime. 08/10/14   Historical Provider, MD  meloxicam (MOBIC) 7.5 MG tablet Take 1 tablet (7.5 mg total) by mouth daily as needed for pain. 01/27/16   Trixie DredgeEmily Laurent Cargile, PA-C  ranitidine (ZANTAC) 75 MG tablet Take 75 mg by mouth 2 (two) times daily as needed for heartburn.    Historical Provider, MD  ziprasidone (GEODON) 40 MG capsule Take 40 mg by mouth at bedtime and may repeat dose one time if needed. 08/24/14   Historical Provider, MD  ziprasidone (GEODON) 80 MG capsule Take 80 mg by mouth 2 (two) times daily with a meal.    Historical Provider, MD    Family History No family history on file.  Social History Social History  Substance Use Topics  . Smoking status: Never Smoker  . Smokeless tobacco: Never Used  . Alcohol use No     Allergies   Quetiapine; Risperidone and related; Terbinafine and related; and Topamax [topiramate]   Review of Systems Review of Systems  Constitutional: Negative for chills and fever.  Cardiovascular: Negative for leg swelling.  Musculoskeletal: Positive for arthralgias.  Skin: Negative for color change, pallor and wound.  Allergic/Immunologic: Negative for immunocompromised state.  Neurological: Negative for weakness and numbness.  Hematological: Does not bruise/bleed easily.  Psychiatric/Behavioral: Negative for self-injury.  Physical Exam Updated Vital Signs BP 142/87 (BP Location: Left Arm)   Pulse 84   Temp 98.1 F (36.7 C) (Oral)   Resp 18   Ht 5' (1.524 m)   Wt 96.6 kg   SpO2 99%   BMI 41.60 kg/m   Physical Exam  Constitutional: She appears well-developed and well-nourished. No distress.  HENT:  Head: Normocephalic and atraumatic.  Neck: Neck supple.  Pulmonary/Chest: Effort normal.  Musculoskeletal:  Right knee: Decreased active ROM secondary to pain.  No focal tenderness.  Tense on exam, unable to fully test.  Distal pulses and sensation intact.    Neurological: She  is alert.  Skin: She is not diaphoretic.  Nursing note and vitals reviewed.    ED Treatments / Results  Labs (all labs ordered are listed, but only abnormal results are displayed) Labs Reviewed - No data to display  EKG  EKG Interpretation None       Radiology Dg Knee Complete 4 Views Right  Result Date: 01/27/2016 CLINICAL DATA:  New onset right-sided knee pain after cleaning bathroom today. EXAM: RIGHT KNEE - COMPLETE 4+ VIEW COMPARISON:  Right knee radiographs 08/31/2015. FINDINGS: No evidence of fracture, dislocation, or joint effusion. No evidence of arthropathy or other focal bone abnormality. Soft tissues are unremarkable. IMPRESSION: Negative right knee radiographs. Electronically Signed   By: Marin Robertshristopher  Mattern M.D.   On: 01/27/2016 21:05    Procedures Procedures (including critical care time)  Medications Ordered in ED Medications  HYDROcodone-acetaminophen (NORCO/VICODIN) 5-325 MG per tablet 1 tablet (1 tablet Oral Given 01/27/16 2224)     Initial Impression / Assessment and Plan / ED Course  I have reviewed the triage vital signs and the nursing notes.  Pertinent labs & imaging results that were available during my care of the patient were reviewed by me and considered in my medical decision making (see chart for details).  Clinical Course     Afebrile, nontoxic patient with injury to her right knee while standing up from kneeling position.  Has chronic knee injury.   Xray negative.  Exam limited due to current muscle tension and patient's inability to relax for exam.   D/C home with pain medication, knee immobilizer, crutches, orthopedic follow up.   Discussed result, findings, treatment, and follow up  with patient.  Pt given return precautions.  Pt verbalizes understanding and agrees with plan.      Final Clinical Impressions(s) / ED Diagnoses   Final diagnoses:  Acute pain of right knee    New Prescriptions Discharge Medication List as of 01/27/2016  10:36 PM    START taking these medications   Details  HYDROcodone-acetaminophen (NORCO/VICODIN) 5-325 MG tablet Take 1 tablet by mouth every 4 (four) hours as needed for moderate pain or severe pain., Starting Wed 01/27/2016, Print    meloxicam (MOBIC) 7.5 MG tablet Take 1 tablet (7.5 mg total) by mouth daily as needed for pain., Starting Wed 01/27/2016, Print         WadsworthEmily Darnesha Diloreto, PA-C 01/27/16 2322    Shaune Pollackameron Isaacs, MD 01/28/16 1719

## 2016-02-11 ENCOUNTER — Encounter (HOSPITAL_COMMUNITY): Payer: Self-pay | Admitting: *Deleted

## 2016-02-11 ENCOUNTER — Encounter (HOSPITAL_COMMUNITY)
Admission: RE | Admit: 2016-02-11 | Discharge: 2016-02-11 | Disposition: A | Discharge status: Home / Self Care | Payer: Medicaid Other | Source: Ambulatory Visit | Attending: Orthopedic Surgery | Admitting: Orthopedic Surgery

## 2016-02-11 DIAGNOSIS — Z01812 Encounter for preprocedural laboratory examination: Secondary | ICD-10-CM | POA: Insufficient documentation

## 2016-02-11 HISTORY — DX: Prediabetes: R73.03

## 2016-02-11 HISTORY — DX: Bipolar disorder, unspecified: F31.9

## 2016-02-11 HISTORY — DX: Anxiety disorder, unspecified: F41.9

## 2016-02-11 LAB — BASIC METABOLIC PANEL
Anion gap: 8 (ref 5–15)
BUN: 7 mg/dL (ref 6–20)
CHLORIDE: 108 mmol/L (ref 101–111)
CO2: 24 mmol/L (ref 22–32)
CREATININE: 0.87 mg/dL (ref 0.44–1.00)
Calcium: 9.4 mg/dL (ref 8.9–10.3)
GFR calc Af Amer: >60 mL/min (ref >60)
GFR calc non Af Amer: >60 mL/min (ref >60)
GLUCOSE: 115 mg/dL — AB (ref 65–99)
POTASSIUM: 4 mmol/L (ref 3.5–5.1)
Sodium: 140 mmol/L (ref 135–145)

## 2016-02-11 LAB — CBC
HEMATOCRIT: 36.4 % (ref 36.0–46.0)
HEMOGLOBIN: 11.4 g/dL — AB (ref 12.0–15.0)
MCH: 24.8 pg — AB (ref 26.0–34.0)
MCHC: 31.3 g/dL (ref 30.0–36.0)
MCV: 79.1 fL (ref 78.0–100.0)
Platelets: 539 10*3/uL — ABNORMAL HIGH (ref 150–400)
RBC: 4.6 MIL/uL (ref 3.87–5.11)
RDW: 15.9 % — ABNORMAL HIGH (ref 11.5–15.5)
WBC: 8.8 10*3/uL (ref 4.0–10.5)

## 2016-02-11 LAB — HCG, SERUM, QUALITATIVE: PREG SERUM: NEGATIVE

## 2016-02-11 LAB — GLUCOSE, CAPILLARY: Glucose-Capillary: 125 mg/dL — ABNORMAL HIGH (ref 65–99)

## 2016-02-11 NOTE — Pre-Procedure Instructions (Signed)
Hayley Romero  02/11/2016      [name]- [place] - [email or web address] [place] [id number] Phone: [phone] Fax: [phone]    Your procedure is scheduled on  Friday  02/19/16  [facility] at 530 A.M.  Call this number if you have problems the morning of surgery:  [phone]   Remember:  Do not eat food or drink liquids after midnight.  Take these medicines the morning of surgery with A SIP OF WATER   HYDROCODONE IF NEEDED   (STOP 7 DAYS PRIOR TO SURGERY- ASPIRIN OR ASPIRIN PRODUCTS, IBUPROFEN/ ADVIL/ MOTRIN, GOODY POWDERS, BC'S, MELOXICAM/ MOBIC, HERBAL MEDICINES)   Do not wear jewelry, make-up or nail polish.  Do not wear lotions, powders, or perfumes, or deoderant.  Do not shave 48 hours prior to surgery.  Men may shave face and neck.  Do not bring valuables to the hospital.  [facility] is not responsible for any belongings or valuables.  Contacts, dentures or bridgework may not be worn into surgery.  Leave your suitcase in the car.  After surgery it may be brought to your room.  For patients admitted to the hospital, discharge time will be determined by your treatment team.  Patients discharged the day of surgery will not be allowed to drive home.   Name and phone number of your driver:    Special instructions:  [place] - Preparing for Surgery  Before surgery, you can play an important role.  Because skin is not sterile, your skin needs to be as free of germs as possible.  You can reduce the number of germs on you skin by washing with CHG (chlorahexidine gluconate) soap before surgery.  CHG is an antiseptic cleaner which kills germs and bonds with the skin to continue killing germs even after washing.  Please DO NOT use if you have an allergy to CHG or antibacterial soaps.  If your skin becomes reddened/irritated stop using the CHG and inform your nurse when you arrive at Short Stay.  Do  not shave (including legs and underarms) for at least 48 hours prior to the first CHG shower.  You may shave your face.  Please follow these instructions carefully:   1.  Shower with CHG Soap the night before surgery and the                                morning of Surgery.  2.  If you choose to wash your hair, wash your hair first as usual with your       normal shampoo.  3.  After you shampoo, rinse your hair and body thoroughly to remove the                      Shampoo.  4.  Use CHG as you would any other liquid soap.  You can apply chg directly       to the skin and wash gently with scrungie or a clean washcloth.  5.  Apply the CHG Soap to your body ONLY FROM THE NECK DOWN.        Do not use on open wounds or open sores.  Avoid contact with your eyes,       ears, mouth and genitals (private parts).  Wash genitals (private parts)       with your normal soap.  6.  Wash thoroughly, paying special attention to the area where your surgery        will be performed.  7.  Thoroughly rinse your body with warm water from the neck down.  8.  DO NOT shower/wash with your normal soap after using and rinsing off       the CHG Soap.  9.  Pat yourself dry with a clean towel.            10.  Wear clean pajamas.            11.  Place clean sheets on your bed the night of your first shower and do not        sleep with pets.  Day of Surgery  Do not apply any lotions/deoderants the morning of surgery.  Please wear clean clothes to the hospital/surgery center.    Please read over the following fact sheets that you were given. Surgical Site Infection Prevention

## 2016-02-16 ENCOUNTER — Ambulatory Visit: Payer: Self-pay | Admitting: Physician Assistant

## 2016-02-16 NOTE — H&P (Signed)
Hayley Romero is an 30 y.o. female.   Chief Complaint: right knee pain and instability  HPI: She was lost to follow up with known ACL, meniscus tears.  At this point, she has reinjured her knee.  Felt three distinct pops in her knee.  Went to the ER.   Hayley Romero has a bucket handle which she had before. Again, she had a diagnosis of an ACL clinically and radiographically last year. I did a new MRI to see if we have a lot of new pathology, it is a little unusual, I did not see her ACL. I am pretty sure she has an ACL based on her exam and old MRI.   Past Medical History:  Diagnosis Date  . Anxiety   . Bipolar disorder (HCC)   . Pre-diabetes   . Prediabetes     Past Surgical History:  Procedure Laterality Date  . WISDOM TOOTH EXTRACTION  2002    No family history on file. Social History:  reports that she has never smoked. She has never used smokeless tobacco. She reports that she does not drink alcohol or use drugs.  Allergies:  Allergies  Allergen Reactions  . Quetiapine Hives    Pt reported hives and trouble breathing, improved with benadryl.  . Latex Hives  . Risperidone And Related Other (See Comments)    Lactation with breast   . Terbinafine And Related Hives  . Topamax [Topiramate] Hives     (Not in a hospital admission)  No results found for this or any previous visit (from the past 48 hour(s)). No results found.  Review of Systems  Musculoskeletal: Positive for falls and joint pain.  All other systems reviewed and are negative.   Last menstrual period 02/11/2016. Physical Exam  Constitutional: She is oriented to person, place, and time. She appears well-developed and well-nourished. No distress.  HENT:  Head: Normocephalic and atraumatic.  Nose: Nose normal.  Eyes: Conjunctivae and EOM are normal. Pupils are equal, round, and reactive to light.  Neck: Normal range of motion. Neck supple.  Cardiovascular: Normal rate and intact distal pulses.   Respiratory:  Effort normal. No respiratory distress.  GI: Soft. She exhibits no distension. There is no tenderness.  Musculoskeletal:       Right knee: She exhibits swelling and effusion. Tenderness found.  Positive lachman  Neurological: She is alert and oriented to person, place, and time.  Skin: Skin is warm and dry. No rash noted. No erythema.  Psychiatric: She has a normal mood and affect. Her behavior is normal.     Assessment/Plan She is a candidate for arthroscopy, exam under anesthesia right knee, meniscectomy versus repair. Although somewhat sedentary she is young enough that I would strongly recommend reconstruction. We will look forward to getting that scheduled as soon as possible due to some limitations due to my schedule as well as the fact [facility] due to [facility]. I would say we would give her an alternative it is not soon enough and she may possibly see one of the partners in the practice as well.   Margart SicklesChadwell, Cary Wilford, PA-C 02/16/2016, 2:07 PM

## 2016-02-19 ENCOUNTER — Encounter (HOSPITAL_COMMUNITY)
Admission: RE | Disposition: A | Discharge status: Home / Self Care | Payer: Self-pay | Source: Ambulatory Visit | Attending: Orthopedic Surgery

## 2016-02-19 ENCOUNTER — Ambulatory Visit (HOSPITAL_COMMUNITY): Payer: Medicaid Other | Admitting: Anesthesiology

## 2016-02-19 ENCOUNTER — Ambulatory Visit (HOSPITAL_COMMUNITY)
Admission: RE | Admit: 2016-02-19 | Discharge: 2016-02-19 | Disposition: A | Discharge status: Home / Self Care | Payer: Medicaid Other | Source: Ambulatory Visit | Attending: Orthopedic Surgery | Admitting: Orthopedic Surgery

## 2016-02-19 ENCOUNTER — Encounter (HOSPITAL_COMMUNITY): Payer: Self-pay | Admitting: *Deleted

## 2016-02-19 DIAGNOSIS — Z888 Allergy status to other drugs, medicaments and biological substances status: Secondary | ICD-10-CM | POA: Diagnosis not present

## 2016-02-19 DIAGNOSIS — M23231 Derangement of other medial meniscus due to old tear or injury, right knee: Secondary | ICD-10-CM | POA: Insufficient documentation

## 2016-02-19 DIAGNOSIS — Z6841 Body Mass Index (BMI) 40.0 and over, adult: Secondary | ICD-10-CM | POA: Diagnosis not present

## 2016-02-19 DIAGNOSIS — F319 Bipolar disorder, unspecified: Secondary | ICD-10-CM | POA: Diagnosis not present

## 2016-02-19 DIAGNOSIS — M94261 Chondromalacia, right knee: Secondary | ICD-10-CM | POA: Diagnosis not present

## 2016-02-19 DIAGNOSIS — Z91040 Latex allergy status: Secondary | ICD-10-CM | POA: Insufficient documentation

## 2016-02-19 DIAGNOSIS — F419 Anxiety disorder, unspecified: Secondary | ICD-10-CM | POA: Insufficient documentation

## 2016-02-19 DIAGNOSIS — S83511A Sprain of anterior cruciate ligament of right knee, initial encounter: Secondary | ICD-10-CM | POA: Diagnosis present

## 2016-02-19 HISTORY — PX: KNEE ARTHROSCOPY WITH ANTERIOR CRUCIATE LIGAMENT (ACL) REPAIR WITH HAMSTRING GRAFT: SHX5645

## 2016-02-19 SURGERY — KNEE ARTHROSCOPY WITH ANTERIOR CRUCIATE LIGAMENT (ACL) REPAIR WITH HAMSTRING GRAFT
Anesthesia: Regional | Site: Knee | Laterality: Right

## 2016-02-19 MED ORDER — ONDANSETRON HCL 4 MG/2ML IJ SOLN
INTRAMUSCULAR | Status: AC
  Filled 2016-02-19: qty 2

## 2016-02-19 MED ORDER — OXYCODONE HCL 5 MG PO TABS
5.0000 mg | ORAL_TABLET | Freq: Once | ORAL | Status: AC | PRN
  Administered 2016-02-19: 5 mg via ORAL

## 2016-02-19 MED ORDER — CHLORHEXIDINE GLUCONATE 4 % EX LIQD: 60.0000 mL | Freq: Once | CUTANEOUS | Status: DC

## 2016-02-19 MED ORDER — OXYCODONE HCL 5 MG/5ML PO SOLN: 5.0000 mg | Freq: Once | ORAL | Status: AC | PRN

## 2016-02-19 MED ORDER — STERILE WATER FOR IRRIGATION IR SOLN
Status: DC | PRN
  Administered 2016-02-19: 20 mL

## 2016-02-19 MED ORDER — GLYCOPYRROLATE 0.2 MG/ML IJ SOLN
INTRAMUSCULAR | Status: DC | PRN
  Administered 2016-02-19: 0.4 mg via INTRAVENOUS

## 2016-02-19 MED ORDER — ONDANSETRON HCL 4 MG/2ML IJ SOLN
INTRAMUSCULAR | Status: DC | PRN
  Administered 2016-02-19: 4 mg via INTRAVENOUS

## 2016-02-19 MED ORDER — PROPOFOL 10 MG/ML IV BOLUS
INTRAVENOUS | Status: DC | PRN
  Administered 2016-02-19: 200 mg via INTRAVENOUS

## 2016-02-19 MED ORDER — LIDOCAINE HCL (CARDIAC) 20 MG/ML IV SOLN
INTRAVENOUS | Status: DC | PRN
  Administered 2016-02-19: 40 mg via INTRAVENOUS

## 2016-02-19 MED ORDER — SODIUM CHLORIDE 0.9 % IV SOLN: INTRAVENOUS | Status: DC

## 2016-02-19 MED ORDER — CEFAZOLIN SODIUM-DEXTROSE 2-4 GM/100ML-% IV SOLN
2.0000 g | INTRAVENOUS | Status: AC
  Administered 2016-02-19: 2 g via INTRAVENOUS
  Filled 2016-02-19: qty 100

## 2016-02-19 MED ORDER — LIDOCAINE 2% (20 MG/ML) 5 ML SYRINGE
INTRAMUSCULAR | Status: AC
  Filled 2016-02-19: qty 5

## 2016-02-19 MED ORDER — PROPOFOL 10 MG/ML IV BOLUS
INTRAVENOUS | Status: AC
  Filled 2016-02-19: qty 40

## 2016-02-19 MED ORDER — FENTANYL CITRATE (PF) 100 MCG/2ML IJ SOLN
INTRAMUSCULAR | Status: DC | PRN
  Administered 2016-02-19: 100 ug via INTRAVENOUS
  Administered 2016-02-19 (×2): 50 ug via INTRAVENOUS

## 2016-02-19 MED ORDER — METHOCARBAMOL 1000 MG/10ML IJ SOLN: 500.0000 mg | Freq: Four times a day (QID) | INTRAVENOUS | Status: DC | PRN

## 2016-02-19 MED ORDER — KETOROLAC TROMETHAMINE 30 MG/ML IJ SOLN
INTRAMUSCULAR | Status: DC | PRN
  Administered 2016-02-19: 30 mg via INTRAVENOUS

## 2016-02-19 MED ORDER — FENTANYL CITRATE (PF) 100 MCG/2ML IJ SOLN
INTRAMUSCULAR | Status: AC
  Filled 2016-02-19: qty 2

## 2016-02-19 MED ORDER — METHOCARBAMOL 500 MG PO TABS: 500.0000 mg | ORAL_TABLET | Freq: Four times a day (QID) | ORAL | Status: DC | PRN

## 2016-02-19 MED ORDER — ROCURONIUM BROMIDE 50 MG/5ML IV SOSY
PREFILLED_SYRINGE | INTRAVENOUS | Status: AC
  Filled 2016-02-19: qty 5

## 2016-02-19 MED ORDER — HYDROCODONE-ACETAMINOPHEN 7.5-325 MG PO TABS: 1.0000 | ORAL_TABLET | Freq: Four times a day (QID) | ORAL | 0 refills | Status: DC | PRN

## 2016-02-19 MED ORDER — EPINEPHRINE PF 1 MG/ML IJ SOLN
INTRAMUSCULAR | Status: AC
  Filled 2016-02-19: qty 5

## 2016-02-19 MED ORDER — OXYCODONE HCL 5 MG PO TABS
ORAL_TABLET | ORAL | Status: AC
  Filled 2016-02-19: qty 1

## 2016-02-19 MED ORDER — HYDROCODONE-ACETAMINOPHEN 7.5-325 MG PO TABS: 1.0000 | ORAL_TABLET | ORAL | Status: DC | PRN

## 2016-02-19 MED ORDER — 0.9 % SODIUM CHLORIDE (POUR BTL) OPTIME
TOPICAL | Status: DC | PRN
  Administered 2016-02-19: 1000 mL

## 2016-02-19 MED ORDER — MIDAZOLAM HCL 2 MG/2ML IJ SOLN
INTRAMUSCULAR | Status: AC
  Filled 2016-02-19: qty 2

## 2016-02-19 MED ORDER — HYDROMORPHONE HCL 1 MG/ML IJ SOLN: 0.2500 mg | INTRAMUSCULAR | Status: DC | PRN

## 2016-02-19 MED ORDER — MIDAZOLAM HCL 5 MG/5ML IJ SOLN
INTRAMUSCULAR | Status: DC | PRN
  Administered 2016-02-19 (×2): 1 mg via INTRAVENOUS

## 2016-02-19 MED ORDER — SODIUM CHLORIDE 0.9 % IR SOLN
Status: DC | PRN
  Administered 2016-02-19: 6000 mL

## 2016-02-19 MED ORDER — ROPIVACAINE HCL 7.5 MG/ML IJ SOLN
INTRAMUSCULAR | Status: DC | PRN
  Administered 2016-02-19: 20 mL via PERINEURAL

## 2016-02-19 MED ORDER — METHOCARBAMOL 500 MG PO TABS: 500.0000 mg | ORAL_TABLET | Freq: Four times a day (QID) | ORAL | 0 refills | Status: DC | PRN

## 2016-02-19 MED ORDER — NEOSTIGMINE METHYLSULFATE 10 MG/10ML IV SOLN
INTRAVENOUS | Status: DC | PRN
  Administered 2016-02-19: 3 mg via INTRAVENOUS

## 2016-02-19 MED ORDER — ROCURONIUM BROMIDE 100 MG/10ML IV SOLN
INTRAVENOUS | Status: DC | PRN
  Administered 2016-02-19: 50 mg via INTRAVENOUS

## 2016-02-19 MED ORDER — LACTATED RINGERS IV SOLN
INTRAVENOUS | Status: DC | PRN
  Administered 2016-02-19 (×2): via INTRAVENOUS

## 2016-02-19 SURGICAL SUPPLY — 69 items
ANCHOR BUTTON TIGHTROPE BTB (Anchor) ×3 IMPLANT
ANCHOR BUTTON TIGHTROPE RN 14 (Anchor) ×3 IMPLANT
BANDAGE ACE 6X5 VEL STRL LF (GAUZE/BANDAGES/DRESSINGS) IMPLANT
BANDAGE ELASTIC 6 VELCRO ST LF (GAUZE/BANDAGES/DRESSINGS) ×3 IMPLANT
BANDAGE ESMARK 6X9 LF (GAUZE/BANDAGES/DRESSINGS) IMPLANT
BENZOIN TINCTURE PRP APPL 2/3 (GAUZE/BANDAGES/DRESSINGS) ×3 IMPLANT
BIT DRILL CANN SENTINAL 9 (BIT) ×2 IMPLANT
BIT DRILL CANN SENTINAL 9MM (BIT) ×1
BLADE CUDA 5.5 (BLADE) IMPLANT
BLADE CUTTER GATOR 3.5 (BLADE) ×3 IMPLANT
BLADE GREAT WHITE 4.2 (BLADE) ×4 IMPLANT
BLADE GREAT WHITE 4.2MM (BLADE) ×2
BLADE SURG 11 STRL SS (BLADE) ×3 IMPLANT
BLADE SURG ROTATE 9660 (MISCELLANEOUS) IMPLANT
BNDG ESMARK 6X9 LF (GAUZE/BANDAGES/DRESSINGS)
BNDG GAUZE ELAST 4 BULKY (GAUZE/BANDAGES/DRESSINGS) ×3 IMPLANT
BUR VERTEX HOODED 4.5 (BURR) ×3 IMPLANT
CLOSURE STERI-STRIP 1/2X4 (GAUZE/BANDAGES/DRESSINGS) ×1
CLSR STERI-STRIP ANTIMIC 1/2X4 (GAUZE/BANDAGES/DRESSINGS) ×2 IMPLANT
COVER SURGICAL LIGHT HANDLE (MISCELLANEOUS) ×3 IMPLANT
CUFF TOURNIQUET SINGLE 34IN LL (TOURNIQUET CUFF) IMPLANT
CUFF TOURNIQUET SINGLE 44IN (TOURNIQUET CUFF) IMPLANT
DRAPE ARTHROSCOPY W/POUCH 114 (DRAPES) ×3 IMPLANT
DRAPE OEC MINIVIEW 54X84 (DRAPES) ×3 IMPLANT
DRAPE ORTHO SPLIT 77X108 STRL (DRAPES) ×2
DRAPE SURG ORHT 6 SPLT 77X108 (DRAPES) ×1 IMPLANT
DRAPE U-SHAPE 47X51 STRL (DRAPES) ×3 IMPLANT
DRSG PAD ABDOMINAL 8X10 ST (GAUZE/BANDAGES/DRESSINGS) IMPLANT
DURAPREP 26ML APPLICATOR (WOUND CARE) ×3 IMPLANT
FILTER STRAW FLUID ASPIR (MISCELLANEOUS) IMPLANT
GAUZE SPONGE 4X4 12PLY STRL (GAUZE/BANDAGES/DRESSINGS) ×3 IMPLANT
GAUZE XEROFORM 1X8 LF (GAUZE/BANDAGES/DRESSINGS) IMPLANT
GLOVE BIOGEL PI IND STRL 8 (GLOVE) ×4 IMPLANT
GLOVE BIOGEL PI INDICATOR 8 (GLOVE) ×8
GLOVE ORTHO TXT STRL SZ7.5 (GLOVE) IMPLANT
GLOVE SURG ORTHO 8.0 STRL STRW (GLOVE) IMPLANT
GOWN STRL REUS W/ TWL LRG LVL3 (GOWN DISPOSABLE) ×2 IMPLANT
GOWN STRL REUS W/ TWL XL LVL3 (GOWN DISPOSABLE) ×2 IMPLANT
GOWN STRL REUS W/TWL LRG LVL3 (GOWN DISPOSABLE) ×4
GOWN STRL REUS W/TWL XL LVL3 (GOWN DISPOSABLE) ×4
KIT IMPLANT TIGHTROPE ABS OPEN (Anchor) ×3 IMPLANT
KIT ROOM TURNOVER OR (KITS) ×3 IMPLANT
MANIFOLD NEPTUNE II (INSTRUMENTS) ×3 IMPLANT
NEEDLE 18GX1X1/2 (RX/OR ONLY) (NEEDLE) IMPLANT
NEEDLE HYPO 25GX1X1/2 BEV (NEEDLE) ×3 IMPLANT
NEEDLE SPNL 18GX3.5 QUINCKE PK (NEEDLE) ×6 IMPLANT
NS IRRIG 1000ML POUR BTL (IV SOLUTION) ×3 IMPLANT
PACK ARTHROSCOPY DSU (CUSTOM PROCEDURE TRAY) ×3 IMPLANT
PAD ABD 8X10 STRL (GAUZE/BANDAGES/DRESSINGS) ×3 IMPLANT
PAD ARMBOARD 7.5X6 YLW CONV (MISCELLANEOUS) ×6 IMPLANT
PIN DRILL ACL TIGHTROPE 4MM (PIN) ×3 IMPLANT
PROBE BIPOLAR ATHRO 135MM 90D (MISCELLANEOUS) ×3 IMPLANT
SET ARTHROSCOPY TUBING (MISCELLANEOUS) ×2
SET ARTHROSCOPY TUBING LN (MISCELLANEOUS) ×1 IMPLANT
SPONGE LAP 4X18 X RAY DECT (DISPOSABLE) ×6 IMPLANT
SUCTION FRAZIER HANDLE 10FR (MISCELLANEOUS) ×2
SUCTION TUBE FRAZIER 10FR DISP (MISCELLANEOUS) ×1 IMPLANT
SUT ETHILON 4 0 PS 2 18 (SUTURE) ×3 IMPLANT
SUT MNCRL AB 3-0 PS2 18 (SUTURE) ×3 IMPLANT
SYR 20ML ECCENTRIC (SYRINGE) IMPLANT
SYR 3ML 25GX5/8 SAFETY (SYRINGE) IMPLANT
SYR CONTROL 10ML LL (SYRINGE) IMPLANT
TISSUE GRAFTLINK FGL (Tissue) ×3 IMPLANT
TOWEL OR 17X24 6PK STRL BLUE (TOWEL DISPOSABLE) ×3 IMPLANT
TOWEL OR 17X26 10 PK STRL BLUE (TOWEL DISPOSABLE) ×3 IMPLANT
TUBE CONNECTING 12'X1/4 (SUCTIONS) ×1
TUBE CONNECTING 12X1/4 (SUCTIONS) ×2 IMPLANT
WAND HAND CNTRL MULTIVAC 90 (MISCELLANEOUS) IMPLANT
WATER STERILE IRR 1000ML POUR (IV SOLUTION) ×3 IMPLANT

## 2016-02-19 NOTE — Progress Notes (Signed)
Report given to jena rn as caregiver 

## 2016-02-19 NOTE — Transfer of Care (Signed)
Immediate Anesthesia Transfer of Care Note  Patient: Hayley Romero  Procedure(s) Performed: Procedure(s): KNEE ARTHROSCOPY WITH ANTERIOR CRUCIATE LIGAMENT (ACL) REPAIR WITH HAMSTRING GRAFT AND MEDIAL MENISECTOMY, chondroplasty (Right)  Patient Location: PACU  Anesthesia Type:GA combined with regional for post-op pain  Level of Consciousness: awake, alert  and patient cooperative  Airway & Oxygen Therapy: Patient Spontanous Breathing and Patient connected to nasal cannula oxygen  Post-op Assessment: Report given to RN, Post -op Vital signs reviewed and stable and Patient moving all extremities  Post vital signs: Reviewed and stable  Last Vitals:  Vitals:   02/19/16 0553  BP: (!) 142/88  Pulse: 96  Resp: 18  Temp: 36.6 C    Last Pain:  Vitals:   02/19/16 0602  TempSrc:   PainSc: 8       Patients Stated Pain Goal: 1 (02/19/16 0602)  Complications: No apparent anesthesia complications

## 2016-02-19 NOTE — Discharge Instructions (Signed)
Diet: As you were doing prior to hospitalization   Activity: Increase activity slowly as tolerated  No lifting or driving for 6 weeks  Ok to bend knee but need to have brace on when up and walking.  Shower: May shower without a dressing on post op day #3, NO SOAKING in tub   Dressing: You may change your dressing on post op day #3.  Then change the dressing daily with sterile 4"x4"s gauze dressing  Or band aids.  Weight Bearing: weight bearing as tolerated in knee immobilizer. Use a walker or  Crutches as instructed.   To prevent constipation: you may use a stool softener such as -  Colace ( over the counter) 100 mg by mouth twice a day  Drink plenty of fluids ( prune juice may be helpful) and high fiber foods  Miralax ( over the counter) for constipation as needed.   Precautions: If you experience chest pain or shortness of breath - call 911 immediately For transfer to the hospital emergency department!!  If you develop a fever greater that 101 F, purulent drainage from wound, increased redness or drainage from wound, or calf pain -- Call the office   Follow- Up Appointment: Please call for an appointment to be seen in 1 week  [place] - [phone]

## 2016-02-19 NOTE — Interval H&P Note (Signed)
History and Physical Interval Note:  02/19/2016 7:30 AM  Hayley Romero  has presented today for surgery, with the diagnosis of RIGHT KNEE ACL TEAR, MEDIAL MENISCUS TEAR  The various methods of treatment have been discussed with the patient and family. After consideration of risks, benefits and other options for treatment, the patient has consented to  Procedure(s): KNEE ARTHROSCOPY WITH ANTERIOR CRUCIATE LIGAMENT (ACL) REPAIR WITH HAMSTRING GRAFT AND MEDIAL MENISECTOMY (Right) as a surgical intervention .  The patient's history has been reviewed, patient examined, no change in status, stable for surgery.  I have reviewed the patient's chart and labs.  Questions were answered to the patient's satisfaction.     Hayley Romero,Hayley Romero

## 2016-02-19 NOTE — Anesthesia Preprocedure Evaluation (Addendum)
Anesthesia Evaluation  Patient identified by MRN, date of birth, ID band Patient awake    Reviewed: Allergy & Precautions, NPO status , Patient's Chart, lab work & pertinent test results, reviewed documented beta blocker date and time   History of Anesthesia Complications Negative for: history of anesthetic complications  Airway Mallampati: II  TM Distance: >3 FB Neck ROM: Full    Dental  (+) Teeth Intact, Dental Advisory Given   Pulmonary neg pulmonary ROS,    breath sounds clear to auscultation       Cardiovascular negative cardio ROS   Rhythm:Regular     Neuro/Psych PSYCHIATRIC DISORDERS Anxiety Bipolar Disorder negative neurological ROS     GI/Hepatic negative GI ROS, Neg liver ROS,   Endo/Other  Morbid obesity  Renal/GU negative Renal ROS     Musculoskeletal   Abdominal   Peds  Hematology negative hematology ROS (+)   Anesthesia Other Findings Upper teeth slightly protrude; gaps b/t upper teeth  Reproductive/Obstetrics                            Anesthesia Physical Anesthesia Plan  ASA: II  Anesthesia Plan: General   Post-op Pain Management:  Regional for Post-op pain   Induction: Intravenous  Airway Management Planned: LMA and Oral ETT  Additional Equipment: None  Intra-op Plan:   Post-operative Plan: Extubation in OR  Informed Consent:   Dental advisory given  Plan Discussed with: CRNA, Anesthesiologist and Surgeon  Anesthesia Plan Comments:        Anesthesia Quick Evaluation

## 2016-02-19 NOTE — H&P (View-Only) (Signed)
Hayley Romero is an 30 y.o. female.   Chief Complaint: right knee pain and instability  HPI: She was lost to follow up with known ACL, meniscus tears.  At this point, she has reinjured her knee.  Felt three distinct pops in her knee.  Went to the ER.   Hayley Romero has a bucket handle which she had before. Again, she had a diagnosis of an ACL clinically and radiographically last year. I did a new MRI to see if we have a lot of new pathology, it is a little unusual, I did not see her ACL. I am pretty sure she has an ACL based on her exam and old MRI.   Past Medical History:  Diagnosis Date  . Anxiety   . Bipolar disorder (HCC)   . Pre-diabetes   . Prediabetes     Past Surgical History:  Procedure Laterality Date  . WISDOM TOOTH EXTRACTION  2002    No family history on file. Social History:  reports that she has never smoked. She has never used smokeless tobacco. She reports that she does not drink alcohol or use drugs.  Allergies:  Allergies  Allergen Reactions  . Quetiapine Hives    Pt reported hives and trouble breathing, improved with benadryl.  . Latex Hives  . Risperidone And Related Other (See Comments)    Lactation with breast   . Terbinafine And Related Hives  . Topamax [Topiramate] Hives     (Not in a hospital admission)  No results found for this or any previous visit (from the past 48 hour(s)). No results found.  Review of Systems  Musculoskeletal: Positive for falls and joint pain.  All other systems reviewed and are negative.   Last menstrual period 02/11/2016. Physical Exam  Constitutional: She is oriented to person, place, and time. She appears well-developed and well-nourished. No distress.  HENT:  Head: Normocephalic and atraumatic.  Nose: Nose normal.  Eyes: Conjunctivae and EOM are normal. Pupils are equal, round, and reactive to light.  Neck: Normal range of motion. Neck supple.  Cardiovascular: Normal rate and intact distal pulses.   Respiratory:  Effort normal. No respiratory distress.  GI: Soft. She exhibits no distension. There is no tenderness.  Musculoskeletal:       Right knee: She exhibits swelling and effusion. Tenderness found.  Positive lachman  Neurological: She is alert and oriented to person, place, and time.  Skin: Skin is warm and dry. No rash noted. No erythema.  Psychiatric: She has a normal mood and affect. Her behavior is normal.     Assessment/Plan She is a candidate for arthroscopy, exam under anesthesia right knee, meniscectomy versus repair. Although somewhat sedentary she is young enough that I would strongly recommend reconstruction. We will look forward to getting that scheduled as soon as possible due to some limitations due to my schedule as well as the fact she has to be done in the [facility] system due to implants. I would say we would give her an alternative it is not soon enough and she may possibly see one of the partners in the practice as well.   Karim Aiello, PA-C 02/16/2016, 2:07 PM   

## 2016-02-19 NOTE — Brief Op Note (Signed)
02/19/2016  9:42 AM  PATIENT:  Hayley Romero  30 y.o. female  PRE-OPERATIVE DIAGNOSIS:  RIGHT KNEE ACL TEAR, MEDIAL MENISCUS TEAR  POST-OPERATIVE DIAGNOSIS:  RIGHT KNEE ACL TEAR, MEDIAL MENISCUS TEAR  PROCEDURE:  Procedure(s): KNEE ARTHROSCOPY WITH ANTERIOR CRUCIATE LIGAMENT (ACL) REPAIR WITH HAMSTRING GRAFT AND MEDIAL MENISECTOMY, chondroplasty (Right)  SURGEON:  Surgeon(s) and Role:    * Frederico Hammananiel Caffrey, MD - Primary  PHYSICIAN ASSISTANT: Margart SicklesJoshua Shandee Jergens, PA-C  ASSISTANTS:    ANESTHESIA:   regional and general  EBL:  Total I/O In: 1000 [I.V.:1000] Out: -   BLOOD ADMINISTERED:none  DRAINS: none   LOCAL MEDICATIONS USED:  NONE  SPECIMEN:  No Specimen  DISPOSITION OF SPECIMEN:  N/A  COUNTS:  YES  TOURNIQUET:  * No tourniquets in log *  DICTATION: .Other Dictation: Dictation Number unknown  PLAN OF CARE: Discharge to home after PACU  PATIENT DISPOSITION:  PACU - hemodynamically stable.   Delay start of Pharmacological VTE agent (>24hrs) due to surgical blood loss or risk of bleeding: not applicable

## 2016-02-19 NOTE — Anesthesia Procedure Notes (Signed)
Procedure Name: Intubation Date/Time: 02/19/2016 7:45 AM Performed by: Darcey NoraJAMES, Dhruvan Gullion B Pre-anesthesia Checklist: Patient identified, Emergency Drugs available, Suction available and Patient being monitored Patient Re-evaluated:Patient Re-evaluated prior to inductionOxygen Delivery Method: Circle system utilized Preoxygenation: Pre-oxygenation with 100% oxygen Intubation Type: IV induction Ventilation: Mask ventilation without difficulty Laryngoscope Size: Mac and 3 Grade View: Grade II Tube type: Oral Tube size: 7.5 mm Number of attempts: 1 Airway Equipment and Method: Stylet Placement Confirmation: ETT inserted through vocal cords under direct vision,  positive ETCO2 and breath sounds checked- equal and bilateral Secured at: 21 (cm at teeth) cm Tube secured with: Tape Dental Injury: Teeth and Oropharynx as per pre-operative assessment

## 2016-02-19 NOTE — Anesthesia Procedure Notes (Signed)
Anesthesia Regional Block:  Femoral nerve block  Pre-Anesthetic Checklist: ,, timeout performed, Correct Patient, Correct Site, Correct Laterality, Correct Procedure, Correct Position, site marked, Risks and benefits discussed,  Surgical consent,  Pre-op evaluation,  At surgeon's request and post-op pain management  Laterality: Lower and Right  Prep: chloraprep       Needles:  Injection technique: Single-shot  Needle Type: Echogenic Stimulator Needle          Additional Needles:  Procedures: ultrasound guided (picture in chart) Femoral nerve block Narrative:  Start time: 02/19/2016 9:36 AM End time: 02/19/2016 9:44 AM Injection made incrementally with aspirations every 5 mL.  Performed by: Personally  Anesthesiologist: Kennet Mccort  Additional Notes: H+P and labs reviewed, risks and benefits discussed with patient, procedure tolerated well without complications

## 2016-02-20 NOTE — Op Note (Signed)
NAME:  Hayley Romero            ACCOUNT NO.:  [id number]  MEDICAL RECORD NO.:  [id number]  LOCATION:  [facility]                          FACILITY:  [facility]  PHYSICIAN:  Dyke BrackettW. D. Rithik Odea, M.D.    DATE OF BIRTH:  [birth year]  DATE OF PROCEDURE:  02/19/2016 DATE OF DISCHARGE:                              OPERATIVE REPORT   PREOPERATIVE DIAGNOSES: 1. Complete interstitial anterior cruciate ligament tear. 2. Complex bucket-handle tear, medial meniscus. 3. Chondromalacia of medial femoral condyle. 4. Chondromalacia of lateral femoral condyle.  POSTOPERATIVE DIAGNOSES: 1. Complete interstitial anterior cruciate ligament tear. 2. Complex bucket-handle tear, medial meniscus. 3. Chondromalacia of medial femoral condyle. 4. Chondromalacia, lateral femoral condyle.  OPERATION: 1. Arthrex GraftLink allograft ACL construction with BTB TightRope,     and TightRope ABS button. 2. Partial medial meniscectomy (posterior 50% medial meniscus). 3. Debridement chondroplasty, grade-3 lesion, medial femoral condyle. 4. Debridement chondroplasty grade 2 lesion, lateral femoral condyle.  SURGEON:  Dyke BrackettW. D. Keiasha Diep, M.D.  ASSISTANT:  Margart SicklesJoshua Chadwell, PA-C.  ANESTHESIA:  General with femoral nerve block.  DESCRIPTION OF PROCEDURE:  Examination under anesthesia showed Lachman was only 2 to 3+.  She was arthroscoped through the inferomedial and inferolateral portals with an accessory medial portal for instrumentation relative to the ACL.  Systematic inspection of the knee showed the patellofemoral to be normal.  Lateral compartment, lateral meniscus was normal.  Grade-2 lesion, 1 x 1 cm lateral femoral condyle debrided.  A 1 x 2 cm lesion on the medial femoral condyle, grade 3, debrided.  Complex bucket-handle, the anterior edge of the bucket was relatively in good shape for repair.  Unfortunately, the posterior horn was degenerative, and I did not think this was amenable to repair. Therefore, I performed a  partial medial meniscectomy, approximately 50% of her meniscus was removed.  Inspection of the notch showed that the ACL would "heal back to the PCL," but there was a complete tear of the ACL.  We elected to fix this using the Arthrex GraftLink that was prepared on the back table. Specifically, instrumentation we used a 10 mm drill hole on the tibia and a 9 on the femur.  She had a significant amount of narrowed A-frame notch, performed aggressive notchplasty.  We then placed the tibial guide through a small incision on the anterior medial metaphysis, placed a guide pin near the anatomic attachment, slightly posterior to the normal ACL of the tibia.  This was deemed to be acceptable position.  We over-reamed this with a 10-mm reamer.  We then used the transtibial technique placing the transtibial guide on the femoral position at about the 11 o'clock position for the right knee.  Then a through and through pin was placed.  We then passed the GraftLink through the transtibial transfemoral tunnel.  We confirmed that the BTB TightRope was deployed against the metaphysis of the femur with the C-arm.  We then tightened the GraftLink with an ABS button on the distal end.  After placement of the graft, we confirmed excellent position of the graft.  No evidence of impingement and excellent stability of the knee with a trace to negative Lachman. Knee was placed through range of motion, and again, there  was no impingement of the graft noted as well.  Closure was effected with the portals with Monocryl as well as Vicryl and Monocryl on the anteromedial incision.  I believe the plan was then for the femoral nerve block to be done after the procedure, placed in a knee immobilizer, and a bulky dressing.  Taken to recovery room in stable condition.     Dyke BrackettW. D. Ramsie Ostrander, M.D.     WDC/MEDQ  D:  02/19/2016  T:  02/20/2016  Job:  [id number]

## 2016-02-20 NOTE — Anesthesia Postprocedure Evaluation (Signed)
Anesthesia Post Note  Patient: Hayley Romero  Procedure(s) Performed: Procedure(s) (LRB): KNEE ARTHROSCOPY WITH ANTERIOR CRUCIATE LIGAMENT (ACL) REPAIR WITH HAMSTRING GRAFT AND MEDIAL MENISECTOMY, chondroplasty (Right)  Patient location during evaluation: PACU Anesthesia Type: General and Regional Level of consciousness: awake and alert Pain management: pain level controlled Vital Signs Assessment: post-procedure vital signs reviewed and stable Respiratory status: spontaneous breathing, nonlabored ventilation, respiratory function stable and patient connected to nasal cannula oxygen Cardiovascular status: blood pressure returned to baseline and stable Postop Assessment: no signs of nausea or vomiting Anesthetic complications: no       Last Vitals:  Vitals:   02/19/16 1052 02/19/16 1100  BP: (!) 145/97   Pulse: 89 97  Resp:    Temp:      Last Pain:  Vitals:   02/19/16 1100  TempSrc:   PainSc: 9                  Bathsheba Durrett

## 2016-02-20 NOTE — Op Note (Deleted)
  The note originally documented on this encounter has been moved the the encounter in which it belongs.  

## 2016-02-24 ENCOUNTER — Encounter (HOSPITAL_COMMUNITY): Payer: Self-pay | Admitting: Orthopedic Surgery

## 2016-02-26 ENCOUNTER — Ambulatory Visit: Payer: Medicaid Other | Attending: Orthopedic Surgery | Admitting: Physical Therapy

## 2016-02-26 ENCOUNTER — Encounter: Payer: Self-pay | Admitting: Physical Therapy

## 2016-02-26 DIAGNOSIS — R262 Difficulty in walking, not elsewhere classified: Secondary | ICD-10-CM | POA: Diagnosis present

## 2016-02-26 DIAGNOSIS — M25561 Pain in right knee: Secondary | ICD-10-CM | POA: Diagnosis present

## 2016-02-26 DIAGNOSIS — Z9889 Other specified postprocedural states: Secondary | ICD-10-CM | POA: Diagnosis present

## 2016-02-26 NOTE — Therapy (Signed)
[facility] [address] [place], [place], [address] Phone: [phone]   Fax:  [phone]  Physical Therapy Evaluation  Patient Details  Name: Hayley Romero MRN: [id number] Date of Birth: 27-May-1985 Referring Provider: Frederico Hamman, MD  Encounter Date: 02/26/2016      PT End of Session - 02/26/16 6962    Visit Number 1   Number of Visits --  unknown, waiting for medicaid auth   Date for PT Re-Evaluation 04/08/16   Authorization Type Medicaid auth required   PT Start Time 0905   PT Stop Time 0952   PT Time Calculation (min) 47 min   Activity Tolerance Patient tolerated treatment well   Behavior During Therapy [facility] for tasks assessed/performed      Past Medical History:  Diagnosis Date  . Anxiety   . Bipolar disorder (HCC)   . Pre-diabetes   . Prediabetes     Past Surgical History:  Procedure Laterality Date  . KNEE ARTHROSCOPY WITH ANTERIOR CRUCIATE LIGAMENT (ACL) REPAIR WITH HAMSTRING GRAFT Right 02/19/2016   Procedure: KNEE ARTHROSCOPY WITH ANTERIOR CRUCIATE LIGAMENT (ACL) REPAIR WITH HAMSTRING GRAFT AND MEDIAL MENISECTOMY, chondroplasty;  Surgeon: Frederico Hamman, MD;  Location: [facility] OR;  Service: Orthopedics;  Laterality: Right;  . WISDOM TOOTH EXTRACTION  2002    There were no vitals filed for this visit.       Subjective Assessment - 02/26/16 0906    Subjective Difficulty climbing stairs. Moderate-severe pain. Foot swelling.    How long can you stand comfortably? unable   How long can you walk comfortably? unable   Patient Stated Goals walk for long period or falling   Currently in Pain? Yes   Pain Score 7    Pain Location Knee   Pain Orientation Right   Pain Descriptors / Indicators Numbness   Pain Type Surgical pain   Pain Onset In the past 7 days   Pain Frequency Constant   Aggravating Factors  always in pain   Pain Relieving Factors rest            [facility] PT Assessment - 02/26/16 0001       Assessment   Medical Diagnosis R ACL recontstruction, medial menisectomy  HS graft   Referring Provider Frederico Hamman, MD   Onset Date/Surgical Date 02/19/16   Next MD Visit 1/8   Prior Therapy no     Precautions   Precautions Knee     Restrictions   Weight Bearing Restrictions Yes     Balance Screen   Has the patient fallen in the past 6 months Yes   How many times? 1  12/6 resulted in ACL tear     Home Environment   Living Environment Private residence   Living Arrangements Other relatives   Additional Comments 2nd floor apartment     Prior Function   Level of Independence Independent   Vocation On disability   Vocation Requirements unknown, looking for new job     Cognition   Overall Cognitive Status Within Functional Limits for tasks assessed     Observation/Other Assessments   Focus on Therapeutic Outcomes (FOTO)  --  incomplete at eval     Sensation   Additional Comments WFL     ROM / Strength   AROM / PROM / Strength Strength;AROM     AROM   Overall AROM Comments unable to achieve full ext   AROM Assessment Site Knee     Strength   Strength Assessment Site Knee  Palpation   Palpation comment knee TTP     Ambulation/Gait   Assistive device R Axillary Crutch;L Axillary Crutch   Gait Comments pt was not placing foot on ground                   [facility] Adult PT Treatment/Exercise - 02/26/16 0001      Exercises   Exercises Knee/Hip     Knee/Hip Exercises: Standing   Gait Training gait training for partial weight bearing with bilateral axillary crutches     Knee/Hip Exercises: Supine   Quad Sets Limitations quad sets with towel roll     Modalities   Modalities Cryotherapy     Cryotherapy   Number Minutes Cryotherapy 15 Minutes   Cryotherapy Location Knee   Type of Cryotherapy Ice pack                PT Education - 02/26/16 1306    Education provided Yes   Education Details anatomy of condition, POC, HEP,  exercise form/rationale, ice, gait patttern, stair navigation, protocol   Person(s) Educated Patient   Methods Explanation;Demonstration;Tactile cues;Verbal cues;Handout   Comprehension Verbalized understanding;Returned demonstration;Verbal cues required;Tactile cues required;Need further instruction          PT Short Term Goals - 02/26/16 1313      PT SHORT TERM GOAL #1   Title Pt will be able to ambulate 50 ft with proper heel-toe pattern without use of AD by 1/26   Baseline unable at eval (full weight bearing allowed at week 3- 1/19)   Time 3   Period Weeks   Status New     PT SHORT TERM GOAL #2   Title knee ROM 0-130 deg for proper progression through protocol by 2/2   Baseline see flowsheet   Time 4   Period Weeks   Status New           PT Long Term Goals - 02/26/16 1316      PT LONG TERM GOAL #1   Title Pt will be able to walk for at least 10 min without increase in knee pain to improve functional ambulation and community ambulation ability by 2/16   Baseline unable at eval   Time 6   Period Weeks   Status New     PT LONG TERM GOAL #2   Title Pt will verbalize comfort and understanding with long term HEP in order to continue progressing per protocol following d/c   Baseline will continue progressing as necessary   Time 6   Period Weeks   Status New     PT LONG TERM GOAL #3   Title Average pain with daily acitivites <=3/10 to decrease pain effects on functional daily activities   Baseline severe pain at eval   Time 6   Period Weeks   Status New     PT LONG TERM GOAL #4   Title Pt will be able to navigate stairs step over step with use of upper extremities to improve ability with stairs at her apartment   Baseline unable at eval   Time 6   Period Weeks   Status New               Plan - 02/26/16 1308    Clinical Impression Statement Pt presents to PT with complaints of R knee pain following ACL reconstruction via hamstring graft and medial  menisectomy. Pt is one week post-op at eval. Pt arrived late to PT which limited time  for treatment. Pt was not placing foot on floor and crutches were lowered due to being too tall. Pt was able to achieve appropriate gait pattern for partial weight bearing. Reviewed HEP exercises and instructed her to watch videos for further information and demonstration. Pt will benefit form skilled PT in order to progress appropraitely through surgical protocol and reach long term functional goals. CPT code [address] for ACL repair, [phone] for chondroplasty and menisectomy.    Rehab Potential Good   PT Frequency 1x / week   PT Duration 6 weeks   PT Treatment/Interventions ADLs/Self Care Home Management;Cryotherapy;Electrical Stimulation;Functional mobility training;Stair training;Gait training;Ultrasound;Moist Heat;Therapeutic activities;Therapeutic exercise;Balance training;Neuromuscular re-education;Patient/family education;Passive range of motion;Scar mobilization;Manual techniques;Dry needling;Taping   PT Next Visit Plan see protocol in drawer    ROM, gait pattern, stairs    PT Home Exercise Plan quad set, SAQ, heel slides, ankle 4-way, DF stretch, ice 5/day 15 min, practice gait   Consulted and Agree with Plan of Care Patient      Patient will benefit from skilled therapeutic intervention in order to improve the following deficits and impairments:  Abnormal gait, Decreased range of motion, Difficulty walking, Decreased activity tolerance, Pain, Improper body mechanics, Decreased scar mobility, Decreased strength, Postural dysfunction  Visit Diagnosis: S/P ACL reconstruction - Plan: PT plan of care cert/re-cert  Acute pain of right knee - Plan: PT plan of care cert/re-cert  Difficulty in walking, not elsewhere classified - Plan: PT plan of care cert/re-cert     Problem List There are no active problems to display for this patient.   Micalah Cabezas C. Katarzyna Wolven PT, DPT 02/26/16 1:27 PM   [facility]  [facility] Outpatient [facility] [address] [place], [place], [address] Phone: [phone]   Fax:  [phone]  Name: Niajah Sipos MRN: [id number] Date of Birth: [birth year]

## 2016-03-08 ENCOUNTER — Encounter: Payer: Self-pay | Admitting: Physical Therapy

## 2016-03-08 ENCOUNTER — Ambulatory Visit: Payer: Medicaid Other | Admitting: Physical Therapy

## 2016-03-08 DIAGNOSIS — Z9889 Other specified postprocedural states: Secondary | ICD-10-CM | POA: Diagnosis not present

## 2016-03-08 DIAGNOSIS — R262 Difficulty in walking, not elsewhere classified: Secondary | ICD-10-CM

## 2016-03-08 DIAGNOSIS — M25561 Pain in right knee: Secondary | ICD-10-CM

## 2016-03-08 NOTE — Therapy (Signed)
[facility] [address] [place], [place], [address] Phone: [phone]   Fax:  [phone]  Physical Therapy Treatment  Patient Details  Name: Hayley Romero MRN: [id number] Date of Birth: Apr 04, 1985 Referring Provider: Frederico Hamman, MD  Encounter Date: 03/08/2016      PT End of Session - 03/08/16 1027    Visit Number 2   Number of Visits 4   Date for PT Re-Evaluation 04/08/16   Authorization Type Medicaid 3 visits authorized   PT Start Time 1017   PT Stop Time 1108   PT Time Calculation (min) 51 min   Activity Tolerance Patient tolerated treatment well   Behavior During Therapy [facility] for tasks assessed/performed      Past Medical History:  Diagnosis Date  . Anxiety   . Bipolar disorder (HCC)   . Pre-diabetes   . Prediabetes     Past Surgical History:  Procedure Laterality Date  . KNEE ARTHROSCOPY WITH ANTERIOR CRUCIATE LIGAMENT (ACL) REPAIR WITH HAMSTRING GRAFT Right 02/19/2016   Procedure: KNEE ARTHROSCOPY WITH ANTERIOR CRUCIATE LIGAMENT (ACL) REPAIR WITH HAMSTRING GRAFT AND MEDIAL MENISECTOMY, chondroplasty;  Surgeon: Frederico Hamman, MD;  [facility];  Service: Orthopedics;  Laterality: Right;  . WISDOM TOOTH EXTRACTION  2002    There were no vitals filed for this visit.      Subjective Assessment - 03/08/16 1024    Subjective Pt reports high levels of pain and swelling on and off. does not feel safe to stand and take a shower   Patient Stated Goals walk for long period or falling   Currently in Pain? Yes   Pain Score 8    Pain Location Knee   Pain Orientation Right   Pain Descriptors / Indicators Dull   Pain Type Surgical pain   Aggravating Factors  exercises, always hurts   Pain Relieving Factors rest            [facility] PT Assessment - 03/08/16 0001      ROM / Strength   AROM / PROM / Strength PROM;Strength     AROM   Right/Left Knee Right     PROM   PROM Assessment Site Knee   Right/Left Knee Right                     [facility] Adult PT Treatment/Exercise - 03/08/16 0001      Knee/Hip Exercises: Stretches   Knee: Self-Stretch Limitations heel slides with towel     Knee/Hip Exercises: Aerobic   Stationary Bike 5 min ROM to tolerance     Knee/Hip Exercises: Standing   Wall Squat Limitations mini squats at counter   Gait Training for normalized pattern with crutches   Other Standing Knee Exercises standing weight shift laterals     Knee/Hip Exercises: Supine   Quad Sets Limitations with cues for toes up and knee extension                PT Education - 03/08/16 1258    Education provided Yes   Education Details exercise form/rationale, HEP, progression through protocol, gait pattern, pain/swelling with healing   Person(s) Educated Patient   Methods Explanation;Demonstration;Tactile cues;Verbal cues;Handout   Comprehension Verbalized understanding;Returned demonstration;Verbal cues required;Tactile cues required;Need further instruction          PT Short Term Goals - 02/26/16 1313      PT SHORT TERM GOAL #1   Title Pt will be able to ambulate 50 ft with proper heel-toe  pattern without use of AD by 1/26   Baseline unable at eval (full weight bearing allowed at week 3- 1/19)   Time 3   Period Weeks   Status New     PT SHORT TERM GOAL #2   Title knee ROM 0-130 deg for proper progression through protocol by 2/2   Baseline see flowsheet   Time 4   Period Weeks   Status New           PT Long Term Goals - 02/26/16 1316      PT LONG TERM GOAL #1   Title Pt will be able to walk for at least 10 min without increase in knee pain to improve functional ambulation and community ambulation ability by 2/16   Baseline unable at eval   Time 6   Period Weeks   Status New     PT LONG TERM GOAL #2   Title Pt will verbalize comfort and understanding with long term HEP in order to continue progressing per protocol following d/c    Baseline will continue progressing as necessary   Time 6   Period Weeks   Status New     PT LONG TERM GOAL #3   Title Average pain with daily acitivites <=3/10 to decrease pain effects on functional daily activities   Baseline severe pain at eval   Time 6   Period Weeks   Status New     PT LONG TERM GOAL #4   Title Pt will be able to navigate stairs step over step with use of upper extremities to improve ability with stairs at her apartment   Baseline unable at eval   Time 6   Period Weeks   Status New               Plan - 03/08/16 1101    Clinical Impression Statement Progressed exercises as tolerated today. Pt demo significant improvement in gait pattern. Educated on importance of doing exercises to progress strength and ROM, discussed normal pain sensations to feel with heeling. Medicaid approved 3 visits so we will treat once/week and pt was instructed to be very consistent with HEP. [facility]    PT Next Visit Plan see protocol in drawer    ROM, gait pattern, stairs    Consulted and Agree with Plan of Care Patient      Patient will benefit from skilled therapeutic intervention in order to improve the following deficits and impairments:     Visit Diagnosis: Difficulty in walking, not elsewhere classified  Acute pain of right knee     Problem List There are no active problems to display for this patient.  Jennamarie Goings C. Shelsie Tijerino PT, DPT 03/08/16 1:00 PM   [facility] [address] [place], [place], [address] Phone: [phone]   Fax:  [phone]  Name: Ernestene KielShakera Strehl MRN: [id number] Date of Birth: 1985-03-30

## 2016-03-17 ENCOUNTER — Ambulatory Visit: Payer: Medicaid Other | Admitting: Physical Therapy

## 2016-03-17 DIAGNOSIS — R262 Difficulty in walking, not elsewhere classified: Secondary | ICD-10-CM

## 2016-03-17 DIAGNOSIS — M25561 Pain in right knee: Secondary | ICD-10-CM

## 2016-03-17 DIAGNOSIS — Z9889 Other specified postprocedural states: Secondary | ICD-10-CM

## 2016-03-17 NOTE — Therapy (Signed)
[facility] [address] [place], [place], [address] Phone: [phone]   Fax:  [phone]  Physical Therapy Treatment  Patient Details  Name: Hayley Romero MRN: [id number] Date of Birth: 02-08-1986 Referring Provider: Frederico Hammananiel Caffrey, MD  Encounter Date: 03/17/2016      PT End of Session - 03/17/16 1101    Visit Number 3   Number of Visits 4   Date for PT Re-Evaluation 04/08/16   Authorization Type Medicaid 3 visits authorized   PT Start Time 1005   PT Stop Time 1106   PT Time Calculation (min) 61 min      Past Medical History:  Diagnosis Date  . Anxiety   . Bipolar disorder (HCC)   . Pre-diabetes   . Prediabetes     Past Surgical History:  Procedure Laterality Date  . KNEE ARTHROSCOPY WITH ANTERIOR CRUCIATE LIGAMENT (ACL) REPAIR WITH HAMSTRING GRAFT Right 02/19/2016   Procedure: KNEE ARTHROSCOPY WITH ANTERIOR CRUCIATE LIGAMENT (ACL) REPAIR WITH HAMSTRING GRAFT AND MEDIAL MENISECTOMY, chondroplasty;  Surgeon: Frederico Hammananiel Caffrey, MD;  [facility];  Service: Orthopedics;  Laterality: Right;  . WISDOM TOOTH EXTRACTION  2002    There were no vitals filed for this visit.      Subjective Assessment - 03/17/16 1104    Currently in Pain? Yes   Pain Score 6    Pain Location Knee   Pain Orientation Right   Pain Descriptors / Indicators Dull   Pain Type Surgical pain   Aggravating Factors  trying to straighten    Pain Relieving Factors rest             [facility] PT Assessment - 03/17/16 0001      PROM   Right/Left Knee Right;Left   Right Knee Extension -18   Right Knee Flexion 85  after PROM stretching      Ambulation/Gait   Assistive device L Axillary Crutch   Gait Pattern Within Functional Limits;Decreased dorsiflexion - right;Decreased dorsiflexion - left   Gait Comments Cues to heel strike and step through                      [facility] PT Treatment/Exercise - 03/17/16 0001      Ambulation/Gait   Ambulation Distance (Feet) 100 Feet     Knee/Hip Exercises: Stretches   Active Hamstring Stretch Right;3 reps;30 seconds   Active Hamstring Stretch Limitations with strap- too painful posterior knee, better in long sitting.    Knee: Self-Stretch Limitations heel slides with strap 3 x 30 sec holds for ROM     Knee/Hip Exercises: Aerobic   Stationary Bike 5 min ROM to tolerance  partial revolutions      Knee/Hip Exercises: Standing   Gait Training for normalized pattern with one crutch     Knee/Hip Exercises: Supine   Quad Sets Limitations with heel prop , multiple reps thorughout treatment in various positions      Modalities   Modalities Doctor, general practicelectrical Stimulation     Electrical Stimulation   Electrical Stimulation Location Right Investment banker, operationalQuad   Electrical Stimulation Action Russian x 15 min    Electrical Stimulation Parameters 10/30 23 mA    Electrical Stimulation Goals Strength     Manual Therapy   Manual Therapy Passive ROM   Passive ROM sustained knee flexion stretches 3 x 30 sec , leg off table                 PT Education - 03/17/16 1129  Education provided Yes   Education Details Long sitting hamstring, AA Heel slide with strap    Person(s) Educated Patient   Methods Explanation;Handout   Comprehension Verbalized understanding          PT Short Term Goals - 02/26/16 1313      PT SHORT TERM GOAL #1   Title Pt will be able to ambulate 50 ft with proper heel-toe pattern without use of AD by 1/26   Baseline unable at eval (full weight bearing allowed at week 3- 1/19)   Time 3   Period Weeks   Status New     PT SHORT TERM GOAL #2   Title knee ROM 0-130 deg for proper progression through protocol by 2/2   Baseline see flowsheet   Time 4   Period Weeks   Status New           PT Long Term Goals - 02/26/16 1316      PT LONG TERM GOAL #1   Title Pt will be able to walk for at least 10 min without increase in knee pain to improve  functional ambulation and community ambulation ability by 2/16   Baseline unable at eval   Time 6   Period Weeks   Status New     PT LONG TERM GOAL #2   Title Pt will verbalize comfort and understanding with long term HEP in order to continue progressing per protocol following d/c   Baseline will continue progressing as necessary   Time 6   Period Weeks   Status New     PT LONG TERM GOAL #3   Title Average pain with daily acitivites <=3/10 to decrease pain effects on functional daily activities   Baseline severe pain at eval   Time 6   Period Weeks   Status New     PT LONG TERM GOAL #4   Title Pt will be able to navigate stairs step over step with use of upper extremities to improve ability with stairs at her apartment   Baseline unable at eval   Time 6   Period Weeks   Status New               Plan - 03/17/16 1008    Clinical Impression Statement follow up on Jan 29 with Dr Madelon Lips. After next visit she will transition to PT at [facility]. She reports performing quad sets throughout the day and feels like her knee gets straight but when she walks in will not straighten. We added hamstring stretch and assisted heel slides for ROM. We also used [employer] to neuro re-educate her quad. After treatmenet pt demonstrates increased knee flexion during gait and reports less pain.    PT Next Visit Plan see protocol in drawer    ROM, gait pattern, stairs    PT Home Exercise Plan quad set, SAQ, heel slides, ankle 4-way, DF stretch, ice 5/day 15 min, practice gait   Consulted and Agree with Plan of Care Patient      Patient will benefit from skilled therapeutic intervention in order to improve the following deficits and impairments:  Abnormal gait, Decreased range of motion, Difficulty walking, Decreased activity tolerance, Pain, Improper body mechanics, Decreased scar mobility, Decreased strength, Postural dysfunction  Visit Diagnosis: Difficulty in walking, not  elsewhere classified  Acute pain of right knee  S/P ACL reconstruction     Problem List There are no active problems to display for this patient.   Hayley Romero,  Hayley Romero, PTA 03/17/2016, 11:38 AM  [facility] [address] [place], [place], [address] Phone: [phone]   Fax:  [phone]  Name: Hayley Romero MRN: [id number] Date of Birth: 20-Dec-1985

## 2016-03-25 ENCOUNTER — Encounter: Payer: Self-pay | Admitting: Physical Therapy

## 2016-03-25 ENCOUNTER — Ambulatory Visit: Payer: Medicaid Other | Attending: Orthopedic Surgery | Admitting: Physical Therapy

## 2016-03-25 DIAGNOSIS — M25561 Pain in right knee: Secondary | ICD-10-CM | POA: Diagnosis present

## 2016-03-25 DIAGNOSIS — R262 Difficulty in walking, not elsewhere classified: Secondary | ICD-10-CM | POA: Diagnosis not present

## 2016-03-25 NOTE — Therapy (Signed)
[place], [place], [address] Phone: [phone]   Fax:  [phone]  Physical Therapy Treatment/Discharge Summary  Patient Details  Name: Hayley Romero MRN: [id number] Date of Birth: 1985/04/05 Referring Provider: Earlie Server, MD  Encounter Date: 03/25/2016      PT End of Session - 03/25/16 0852    Visit Number 4   Number of Visits 4   Date for PT Re-Evaluation 04/08/16   Authorization Type Medicaid 3 visits authorized   PT Start Time [phone]   PT Stop Time 0945   PT Time Calculation (min) 59 min   Activity Tolerance Patient tolerated treatment well   Behavior During Therapy [facility] for tasks assessed/performed      Past Medical History:  Diagnosis Date  . Anxiety   . Bipolar disorder ([place])   . Pre-diabetes   . Prediabetes     Past Surgical History:  Procedure Laterality Date  . KNEE ARTHROSCOPY WITH ANTERIOR CRUCIATE LIGAMENT (ACL) REPAIR WITH HAMSTRING GRAFT Right 02/19/2016   Procedure: KNEE ARTHROSCOPY WITH ANTERIOR CRUCIATE LIGAMENT (ACL) REPAIR WITH HAMSTRING GRAFT AND MEDIAL MENISECTOMY, chondroplasty;  Surgeon: Earlie Server, MD;  Location: [facility];  Service: Orthopedics;  Laterality: Right;  . [place] EXTRACTION  2002    There were no vitals filed for this visit.      Subjective Assessment - 03/25/16 0850    Subjective Pt reports feeling tight, but not too bad.    Patient Stated Goals walk for long period or falling   Currently in Pain? Yes   Pain Score 3    Pain Location Knee   Pain Orientation Right   Pain Descriptors / Indicators Tightness   Aggravating Factors  ROM   Pain Relieving Factors rest            [facility] PT Assessment - 03/25/16 0001      Assessment   Medical Diagnosis R ACL recontstruction, medial menisectomy   Referring Provider Earlie Server, MD   Onset Date/Surgical Date 02/19/16     PROM   Right Knee Extension -5   Right Knee Flexion 90                      [facility] Adult PT Treatment/Exercise - 03/25/16 0001      Knee/Hip Exercises: Stretches   Knee: Self-Stretch Limitations heel slide with green strap   Other Knee/Hip Stretches supine extension stretch     Knee/Hip Exercises: Aerobic   Nustep 5 min L3     Knee/Hip Exercises: Standing   Functional Squat Limitations mini squat to tap bolster on table   Gait Training on treadmill, PT assist for heel/toe, quad activation and toe off; training with single crutch     Knee/Hip Exercises: Supine   Short Arc Quad Sets Limitations over bolster, ques required     Cryotherapy   Number Minutes Cryotherapy 15 Minutes   Cryotherapy Location Knee   Type of Cryotherapy Ice pack  towel under ankle                PT Education - 03/25/16 1938    Education provided Yes   Education Details exercise form/rationale, progression following d/c, importance of exercises/stretching, gait pattern   Person(s) Educated Patient   Methods Explanation;Demonstration;Tactile cues;Verbal cues   Comprehension Verbalized understanding;Returned demonstration;Verbal cues required;Tactile cues required;Need further instruction          PT Short Term Goals - 03/25/16 8546  PT SHORT TERM GOAL #1   Title Pt will be able to ambulate 50 ft with proper heel-toe pattern without use of AD by 1/26   Baseline using single crutch   Status Not Met     PT SHORT TERM GOAL #2   Title knee ROM 0-130 deg for proper progression through protocol by 2/2   Baseline see flowsheet   Status Not Met           PT Long Term Goals - 03/25/16 0920      PT LONG TERM GOAL #1   Title Pt will be able to walk for at least 10 min without increase in knee pain to improve functional ambulation and community ambulation ability by 2/16   Baseline able with single axillary crutch   Status Achieved     PT LONG TERM GOAL #2   Title Pt will verbalize comfort and understanding with long term HEP in  order to continue progressing per protocol following d/c   Status Achieved     PT LONG TERM GOAL #3   Title Average pain with daily acitivites <=3/10 to decrease pain effects on functional daily activities   Baseline average pain 5/10 with daily activities   Status Not Met     PT LONG TERM GOAL #4   Title Pt will be able to navigate stairs step over step with use of upper extremities to improve ability with stairs at her apartment   Baseline able using UE support   Status Achieved               Plan - 03/25/16 1939    Clinical Impression Statement Utilized treadmill to train gait pattern today which pt had a difficult time with and required extensive cuing and assistance. Pt has not met all goals but is being d/c at this time due to insurance limitations. Pt is scheduled to continue treatment next week at [facility] clinic to continue addressing lack of ROM, strength and functional gait pattern. Pt was instructed to contact us with further questions or concerns.    Consulted and Agree with Plan of Care Patient      Patient will benefit from skilled therapeutic intervention in order to improve the following deficits and impairments:     Visit Diagnosis: Difficulty in walking, not elsewhere classified  Acute pain of right knee     Problem List There are no active problems to display for this patient.  PHYSICAL THERAPY DISCHARGE SUMMARY  Visits from Start of Care: 4  Current functional level related to goals / functional outcomes: See above   Remaining deficits: See above   Education / Equipment: Anatomy of condition, POC, HEP, exercise form/rationale  Plan: Patient agrees to discharge.  Patient goals were not met. Patient is being discharged due to financial reasons.  ?????    Idy Rawling C. Lizania Bouchard PT, DPT 03/25/16 7:46 PM   [facility] [address] [place], [place], [address] Phone: [phone]   Fax:   [phone]  Name: Hayley Romero MRN: [id number] Date of Birth: 1985/09/03

## 2016-04-01 ENCOUNTER — Ambulatory Visit: Payer: Medicaid Other | Admitting: Physical Therapy

## 2016-04-08 ENCOUNTER — Ambulatory Visit: Payer: Medicaid Other | Admitting: Physical Therapy

## 2016-04-15 ENCOUNTER — Encounter: Payer: Medicaid Other | Admitting: Physical Therapy

## 2016-05-20 ENCOUNTER — Other Ambulatory Visit: Payer: Self-pay | Admitting: Family Medicine

## 2016-05-20 DIAGNOSIS — E282 Polycystic ovarian syndrome: Secondary | ICD-10-CM

## 2017-04-02 ENCOUNTER — Emergency Department (HOSPITAL_COMMUNITY)
Admission: EM | Admit: 2017-04-02 | Discharge: 2017-04-02 | Disposition: A | Discharge status: Home / Self Care | Payer: Medicaid Other | Attending: Emergency Medicine | Admitting: Emergency Medicine

## 2017-04-02 ENCOUNTER — Encounter (HOSPITAL_COMMUNITY): Payer: Self-pay | Admitting: *Deleted

## 2017-04-02 ENCOUNTER — Other Ambulatory Visit: Payer: Self-pay

## 2017-04-02 DIAGNOSIS — J029 Acute pharyngitis, unspecified: Secondary | ICD-10-CM | POA: Insufficient documentation

## 2017-04-02 DIAGNOSIS — Z79899 Other long term (current) drug therapy: Secondary | ICD-10-CM | POA: Diagnosis not present

## 2017-04-02 DIAGNOSIS — R05 Cough: Secondary | ICD-10-CM | POA: Diagnosis not present

## 2017-04-02 MED ORDER — DOXYCYCLINE HYCLATE 100 MG PO CAPS: 100.0000 mg | ORAL_CAPSULE | Freq: Two times a day (BID) | ORAL | 0 refills | Status: AC

## 2017-04-02 NOTE — Discharge Instructions (Signed)
Drink plenty of fluids and take Tylenol Motrin for pain follow-up if not improving

## 2017-04-02 NOTE — ED Provider Notes (Signed)
[facility]-EMERGENCY DEPT Provider Note   CSN: [id number] Arrival date & time: 04/02/17  0757     History   Chief Complaint Chief Complaint  Patient presents with  . Sore Throat  . Cough    HPI Hayley Romero is a 32 y.o. female.  Patient complains with sore throat cough.   The history is provided by the patient. No language interpreter was used.  Sore Throat  This is a new problem. The current episode started more than 2 days ago. The problem occurs constantly. The problem has not changed since onset.Pertinent negatives include no chest pain, no abdominal pain and no headaches. Nothing aggravates the symptoms. Nothing relieves the symptoms. She has tried nothing for the symptoms. The treatment provided no relief.    Past Medical History:  Diagnosis Date  . Anxiety   . Bipolar disorder (HCC)   . Pre-diabetes   . Prediabetes     There are no active problems to display for this patient.   Past Surgical History:  Procedure Laterality Date  . KNEE ARTHROSCOPY WITH ANTERIOR CRUCIATE LIGAMENT (ACL) REPAIR WITH HAMSTRING GRAFT Right 02/19/2016   Procedure: KNEE ARTHROSCOPY WITH ANTERIOR CRUCIATE LIGAMENT (ACL) REPAIR WITH HAMSTRING GRAFT AND MEDIAL MENISECTOMY, chondroplasty;  Surgeon: Frederico Hamman, MD;  [facility] OR;  Service: Orthopedics;  Laterality: Right;  . WISDOM TOOTH EXTRACTION  2002    OB History    No data available       Home Medications    Prior to Admission medications   Medication Sig Start Date End Date Taking? Authorizing Provider  clonazePAM (KLONOPIN) 1 MG tablet Take 1 mg by mouth at bedtime.     [provider]  diphenhydrAMINE (BENADRYL) 25 MG tablet Take 25 mg by mouth at bedtime as needed for sleep.     [provider]  doxycycline (VIBRAMYCIN) 100 MG capsule Take 1 capsule (100 mg total) by mouth 2 (two) times daily. One po bid x 7 days 04/02/17   Bethann Berkshire, MD  HYDROcodone-acetaminophen  [facility]) 7.5-325 MG tablet Take 1-2 tablets by mouth every 6 (six) hours as needed for moderate pain. 02/19/16   Chadwell, Ivin Booty, PA-C  HYDROcodone-acetaminophen (NORCO/VICODIN) 5-325 MG tablet Take 1 tablet by mouth every 4 (four) hours as needed for moderate pain or severe pain. 01/27/16   Trixie Dredge, PA-C  hydrocortisone cream 1 % Apply 1 application topically 2 (two) times daily as needed (skin flare ups).    [provider]  ibuprofen (ADVIL,MOTRIN) 800 MG tablet Take 800 mg by mouth every 8 (eight) hours as needed for moderate pain.    [provider]  lamoTRIgine (LAMICTAL) 100 MG tablet Take 200 mg by mouth at bedtime. 08/10/14   [provider]  meloxicam (MOBIC) 7.5 MG tablet Take 1 tablet (7.5 mg total) by mouth daily as needed for pain. Patient not taking: Reported on 02/10/2016 01/27/16   Trixie Dredge, PA-C  methocarbamol (ROBAXIN) 500 MG tablet Take 1 tablet (500 mg total) by mouth every 6 (six) hours as needed for muscle spasms. 02/19/16   Chadwell, Ivin Booty, PA-C  ziprasidone (GEODON) 40 MG capsule Take 40 mg by mouth See admin instructions. Takes 40 mg in the morning and take 40 mg and 80 mg tablet in the evening to equal 120 mg 08/24/14   [provider]  ziprasidone (GEODON) 80 MG capsule Take 80 mg by mouth See admin instructions. Takes 80 mg and 40 mg in the evening to equal 120 mg  [provider]    Family History No family history on file.  Social History Social History   Tobacco Use  . Smoking status: Never Smoker  . Smokeless tobacco: Never Used  Substance Use Topics  . Alcohol use: No  . Drug use: No     Allergies   Quetiapine; Latex; Risperidone and related; Terbinafine and related; and Topamax [topiramate]   Review of Systems Review of Systems  Constitutional: Negative for appetite change and fatigue.  HENT: Negative for congestion, ear discharge and sinus pressure.        Sore throat  Eyes: Negative for  discharge.  Respiratory: Positive for cough.   Cardiovascular: Negative for chest pain.  Gastrointestinal: Negative for abdominal pain and diarrhea.  Genitourinary: Negative for frequency and hematuria.  Musculoskeletal: Negative for back pain.  Skin: Negative for rash.  Neurological: Negative for seizures and headaches.  Psychiatric/Behavioral: Negative for hallucinations.     Physical Exam Updated Vital Signs BP 136/76 (BP Location: Right Arm)   Pulse 83   Temp (!) 97.1 F (36.2 C) (Oral)   Resp 16   Ht 5\' 1"  (1.549 m)   Wt 80.3 kg (177 lb)   SpO2 99%   BMI 33.44 kg/m   Physical Exam  Constitutional: She is oriented to person, place, and time. She appears well-developed.  HENT:  Head: Normocephalic.  Pharynx inflamed  Eyes: Conjunctivae and EOM are normal. No scleral icterus.  Neck: Neck supple. No thyromegaly present.  Cardiovascular: Normal rate and regular rhythm. Exam reveals no gallop and no friction rub.  No murmur heard. Pulmonary/Chest: No stridor. She has no wheezes. She has no rales. She exhibits no tenderness.  Abdominal: She exhibits no distension. There is no tenderness. There is no rebound.  Musculoskeletal: Normal range of motion. She exhibits no edema.  Lymphadenopathy:    She has no cervical adenopathy.  Neurological: She is oriented to person, place, and time. She exhibits normal muscle tone. Coordination normal.  Skin: No rash noted. No erythema.  Psychiatric: She has a normal mood and affect. Her behavior is normal.     ED Treatments / Results  Labs (all labs ordered are listed, but only abnormal results are displayed) Labs Reviewed - No data to display  EKG  EKG Interpretation None       Radiology No results found.  Procedures Procedures (including critical care time)  Medications Ordered in ED Medications - No data to display   Initial Impression / Assessment and Plan / ED Course  I have reviewed the triage vital signs and  the nursing notes.  Pertinent labs & imaging results that were available during my care of the patient were reviewed by me and considered in my medical decision making (see chart for details).     Patient with pharyngitis.  Patient was treated with doxycycline and will follow-up with PCP as needed  Final Clinical Impressions(s) / ED Diagnoses   Final diagnoses:  Sore throat    ED Discharge Orders        Ordered    doxycycline (VIBRAMYCIN) 100 MG capsule  2 times daily     04/02/17 0820       Bethann BerkshireZammit, Lititia Sen, MD 04/02/17 [phone]

## 2017-04-02 NOTE — ED Triage Notes (Signed)
Yesterday developed sore throat and cough. No other symptoms

## 2017-06-19 ENCOUNTER — Emergency Department (HOSPITAL_COMMUNITY)
Admission: EM | Admit: 2017-06-19 | Discharge: 2017-06-19 | Disposition: A | Discharge status: Home / Self Care | Payer: Managed Care, Other (non HMO) | Attending: Emergency Medicine | Admitting: Emergency Medicine

## 2017-06-19 ENCOUNTER — Other Ambulatory Visit: Payer: Self-pay

## 2017-06-19 ENCOUNTER — Encounter (HOSPITAL_COMMUNITY): Payer: Self-pay | Admitting: Emergency Medicine

## 2017-06-19 DIAGNOSIS — R21 Rash and other nonspecific skin eruption: Secondary | ICD-10-CM | POA: Diagnosis present

## 2017-06-19 DIAGNOSIS — L309 Dermatitis, unspecified: Secondary | ICD-10-CM | POA: Insufficient documentation

## 2017-06-19 DIAGNOSIS — Z79899 Other long term (current) drug therapy: Secondary | ICD-10-CM | POA: Insufficient documentation

## 2017-06-19 MED ORDER — TRIAMCINOLONE ACETONIDE 0.1 % EX CREA: 1.0000 "application " | TOPICAL_CREAM | Freq: Two times a day (BID) | CUTANEOUS | 0 refills | Status: DC

## 2017-06-19 NOTE — Discharge Instructions (Addendum)
Please apply Kenalog ointment as prescribed. Also consider applying Vaseline in between the Kenalog to the affected areas of the hand.  As discussed consider please see Dr. Lyman Bishop, your dermatologist for optimal care.

## 2017-06-19 NOTE — ED Triage Notes (Signed)
Called for triage  No response from lobby 

## 2017-06-19 NOTE — ED Provider Notes (Signed)
[facility]-EMERGENCY DEPT Provider Note   CSN: [id number] Arrival date & time: 06/19/17  6962     History   Chief Complaint Chief Complaint  Patient presents with  . Rash    HPI Hayley Romero is a 32 y.o. female.  HPI 32 year old female comes in with chief complaint of rash. Patient has history of anxiety and bipolar disorder.  She states that she sees [facility] and was seen by dermatologist for her rash and diagnosed with eczema (starts with a d - ?discoid eczema).  Patient has had a rash for at least 6 months now.  Over the past few weeks her symptoms have progressed.  Patient is not taking any medication based on dermatologist recommendations.  In the past she has taken steroid ointment.  Over the last week she believes that her rash has spread and now she has burning type pain over her skin.  The rash is located primarily in both of her palms and there has been no color change.   Patient was taking steroids at some point and it was helpful.    Past Medical History:  Diagnosis Date  . Anxiety   . Bipolar disorder (HCC)   . Pre-diabetes   . Prediabetes     There are no active problems to display for this patient.   Past Surgical History:  Procedure Laterality Date  . KNEE ARTHROSCOPY WITH ANTERIOR CRUCIATE LIGAMENT (ACL) REPAIR WITH HAMSTRING GRAFT Right 02/19/2016   Procedure: KNEE ARTHROSCOPY WITH ANTERIOR CRUCIATE LIGAMENT (ACL) REPAIR WITH HAMSTRING GRAFT AND MEDIAL MENISECTOMY, chondroplasty;  Surgeon: Frederico Hamman, MD;  Location: [facility] OR;  Service: Orthopedics;  Laterality: Right;  . WISDOM TOOTH EXTRACTION  2002     OB History   None      Home Medications    Prior to Admission medications   Medication Sig Start Date End Date Taking? Authorizing Provider  clobetasol cream (TEMOVATE) 0.05 % Apply 1 application topically 2 (two) times daily. 04/13/17  Yes [provider]  clonazePAM (KLONOPIN) 0.5 MG tablet  Take 0.5 mg by mouth at bedtime as needed for sleep. 05/12/17  Yes [provider]  lamoTRIgine (LAMICTAL) 100 MG tablet Take 200 mg by mouth at bedtime. 08/10/14  Yes [provider]  levothyroxine (SYNTHROID, LEVOTHROID) 25 MCG tablet Take 25 mcg by mouth daily before breakfast.  03/20/17  Yes [provider]  sulindac (CLINORIL) 200 MG tablet Take 200 mg by mouth 2 (two) times daily as needed for cramping. 06/02/17  Yes [provider]  SYMBICORT 80-4.5 MCG/ACT inhaler Inhale 1 puff into the lungs daily. 06/05/17  Yes [provider]  ziprasidone (GEODON) 80 MG capsule Take 80 mg by mouth 3 (three) times daily with meals. Take 80 mg in the morning and  at lunch time   Yes [provider]  doxycycline (VIBRAMYCIN) 100 MG capsule Take 1 capsule (100 mg total) by mouth 2 (two) times daily. One po bid x 7 days Patient not taking: Reported on 06/19/2017 04/02/17   Bethann Berkshire, MD  triamcinolone cream (KENALOG) 0.1 % Apply 1 application topically 2 (two) times daily. 06/19/17   Derwood Kaplan, MD    Family History Family History  Problem Relation Age of Onset  . Diabetes Other   . Hypertension Other     Social History Social History   Tobacco Use  . Smoking status: Never Smoker  . Smokeless tobacco: Never Used  Substance Use Topics  . Alcohol use: No  .  Drug use: No     Allergies   Quetiapine; Latex; Risperidone and related; Terbinafine and related; and Topamax [topiramate]   Review of Systems Review of Systems  Constitutional: Negative for fever.  Respiratory: Negative for shortness of breath.   Cardiovascular: Negative for chest pain.  Musculoskeletal: Negative for arthralgias.  Skin: Positive for rash.  Allergic/Immunologic: Negative for environmental allergies, food allergies and immunocompromised state.  Neurological: Negative for numbness.     Physical Exam Updated Vital Signs BP (!) 140/92 (BP Location: Right  Arm)   Pulse 65   Temp 98 F (36.7 C) (Oral)   Resp 18   Ht  (1.549 m)   Wt 83.5 kg (184 lb)   LMP 06/12/2017 (Exact Date)   SpO2 100%   BMI 34.77 kg/m   Physical Exam  Constitutional: She is oriented to person, place, and time. She appears well-developed.  HENT:  Head: Normocephalic and atraumatic.  Eyes: EOM are normal.  Neck: Normal range of motion. Neck supple.  Cardiovascular: Normal rate.  Pulmonary/Chest: Effort normal.  Abdominal: Bowel sounds are normal.  Neurological: She is alert and oriented to person, place, and time.  Skin: Skin is warm and dry. Rash noted.  Patient has hyperpigmented patches over both of her palms.  There is scaling over each of the lesions, no sloughing or drainage appreciated.  Nursing note and vitals reviewed.    ED Treatments / Results  Labs (all labs ordered are listed, but only abnormal results are displayed) Labs Reviewed - No data to display  EKG None  Radiology No results found.  Procedures Procedures (including critical care time)  Medications Ordered in ED Medications - No data to display   Initial Impression / Assessment and Plan / ED Course  I have reviewed the triage vital signs and the nursing notes.  Pertinent labs & imaging results that were available during my care of the patient were reviewed by me and considered in my medical decision making (see chart for details).    32 year old female comes in with chief complaint of rash. She carries a diagnosis of eczema based on biopsy results.  Currently she is not on any medications.  It appears that she has a flareup and is having neuropathic pain due to irritation to the nerve endings.  I have called the dermatologist clinic to see if they want any specific treatment given that they had held patient's treatment.  If I do not hear back from them then we will start patient on systemic steroids.   7:58 AM Patient's dermatologist call me back.  He has actually  put patient on 2 different medications for her symptoms.  I asked him if it was okay for me to start patient on topical steroids and then he can see her in the clinic, and he has agreed with the plan.  Patient informed of our conversation and she will call the dermatologist for the earliest available appointment. Final Clinical Impressions(s) / ED Diagnoses   Final diagnoses:  Eczema, unspecified type  Dermatitis    ED Discharge Orders        Ordered    triamcinolone cream (KENALOG) 0.1 %  2 times daily     06/19/17 0915       Derwood Kaplan, MD 06/20/17 [phone]

## 2017-06-19 NOTE — ED Triage Notes (Signed)
Pt states she has a rash on her left hand   Pt states she had a biopsy done and it came back as some form of eczema  Pt states it has gotten worse and her hand burns and itches and sometimes it will ooze

## 2017-07-27 ENCOUNTER — Encounter (HOSPITAL_COMMUNITY): Payer: Self-pay | Admitting: *Deleted

## 2017-07-27 ENCOUNTER — Emergency Department (HOSPITAL_COMMUNITY)
Admission: EM | Admit: 2017-07-27 | Discharge: 2017-07-27 | Disposition: A | Discharge status: Home / Self Care | Payer: Managed Care, Other (non HMO) | Attending: Physician Assistant | Admitting: Physician Assistant

## 2017-07-27 ENCOUNTER — Other Ambulatory Visit: Payer: Self-pay

## 2017-07-27 DIAGNOSIS — Z760 Encounter for issue of repeat prescription: Secondary | ICD-10-CM | POA: Diagnosis not present

## 2017-07-27 DIAGNOSIS — Z79899 Other long term (current) drug therapy: Secondary | ICD-10-CM | POA: Diagnosis not present

## 2017-07-27 DIAGNOSIS — L259 Unspecified contact dermatitis, unspecified cause: Secondary | ICD-10-CM | POA: Diagnosis present

## 2017-07-27 DIAGNOSIS — Z91040 Latex allergy status: Secondary | ICD-10-CM | POA: Insufficient documentation

## 2017-07-27 DIAGNOSIS — R21 Rash and other nonspecific skin eruption: Secondary | ICD-10-CM | POA: Diagnosis not present

## 2017-07-27 MED ORDER — TRIAMCINOLONE ACETONIDE 0.1 % EX CREA: 1.0000 "application " | TOPICAL_CREAM | Freq: Two times a day (BID) | CUTANEOUS | 0 refills | Status: AC

## 2017-07-27 NOTE — ED Provider Notes (Signed)
[facility]-EMERGENCY DEPT Provider Note   CSN: [id number] Arrival date & time: 07/27/17  0603     History   Chief Complaint Chief Complaint  Patient presents with  . Eczema    HPI Hayley Romero is a 32 y.o. female with a past medical history of eczema confirmed by biopsy, anxiety, bipolar, prediabetes, who presents today for evaluation of eczema.  She reports that this is the same thing that she was here for in April, and that she ran out of her cream.  She reports that she has an appointment with a new dermatologist later this month as she is unable to see her former dermatologist due to cost.  She is requesting a refill on her medication today and for me to call her normal dermatologist and get them to see her.   No fevers or chills.  Rash is unchanged.  Is on her hands and underarms, reports that it feels like it is burning.  HPI  Past Medical History:  Diagnosis Date  . Anxiety   . Bipolar disorder (HCC)   . Pre-diabetes   . Prediabetes     There are no active problems to display for this patient.   Past Surgical History:  Procedure Laterality Date  . KNEE ARTHROSCOPY WITH ANTERIOR CRUCIATE LIGAMENT (ACL) REPAIR WITH HAMSTRING GRAFT Right 02/19/2016   Procedure: KNEE ARTHROSCOPY WITH ANTERIOR CRUCIATE LIGAMENT (ACL) REPAIR WITH HAMSTRING GRAFT AND MEDIAL MENISECTOMY, chondroplasty;  Surgeon: Frederico Hamman, MD;  Location: [facility] OR;  Service: Orthopedics;  Laterality: Right;  . WISDOM TOOTH EXTRACTION  2002     OB History   None      Home Medications    Prior to Admission medications   Medication Sig Start Date End Date Taking? Authorizing Provider  clobetasol cream (TEMOVATE) 0.05 % Apply 1 application topically 2 (two) times daily. 04/13/17   [provider]  clonazePAM (KLONOPIN) 0.5 MG tablet Take 0.5 mg by mouth at bedtime as needed for sleep. 05/12/17   [provider]  doxycycline (VIBRAMYCIN) 100 MG capsule Take 1  capsule (100 mg total) by mouth 2 (two) times daily. One po bid x 7 days Patient not taking: Reported on 06/19/2017 04/02/17   Bethann Berkshire, MD  lamoTRIgine (LAMICTAL) 100 MG tablet Take 200 mg by mouth at bedtime. 08/10/14   [provider]  levothyroxine (SYNTHROID, LEVOTHROID) 25 MCG tablet Take 25 mcg by mouth daily before breakfast.  03/20/17   [provider]  sulindac (CLINORIL) 200 MG tablet Take 200 mg by mouth 2 (two) times daily as needed for cramping. 06/02/17   [provider]  SYMBICORT 80-4.5 MCG/ACT inhaler Inhale 1 puff into the lungs daily. 06/05/17   [provider]  triamcinolone cream (KENALOG) 0.1 % Apply 1 application topically 2 (two) times daily. 07/27/17   Cristina Gong, PA-C  ziprasidone (GEODON) 80 MG capsule Take 80 mg by mouth 3 (three) times daily with meals. Take 80 mg in the morning and 80mg  at lunch time    [provider]    Family History Family History  Problem Relation Age of Onset  . Diabetes Other   . Hypertension Other     Social History Social History   Tobacco Use  . Smoking status: Never Smoker  . Smokeless tobacco: Never Used  Substance Use Topics  . Alcohol use: No  . Drug use: No     Allergies   Quetiapine; Latex; Risperidone and related; Terbinafine and related; and  Topamax [topiramate]   Review of Systems Review of Systems  Constitutional: Negative for chills and fever.  Skin: Positive for rash.  All other systems reviewed and are negative.    Physical Exam Updated Vital Signs BP 139/88 (BP Location: Left Arm)   Pulse 77   Temp 97.8 F (36.6 C) (Oral)   Resp 13   Ht 5\' 1"  (1.549 m)   Wt 87.1 kg (192 lb)   LMP 07/19/2017 (Approximate)   SpO2 99%   BMI 36.28 kg/m   Physical Exam  Constitutional: She appears well-developed and well-nourished.  HENT:  Head: Normocephalic.  Pulmonary/Chest: No respiratory distress.  Neurological: She is alert.  Skin: Skin is warm and  dry. She is not diaphoretic.  Hands bilaterally have small vesicles with cracks, no obvious drainage, erythema, induration.   Anterior chest and underarms bilaterally have scaly flat lesions.    Psychiatric: She has a normal mood and affect.  Nursing note and vitals reviewed.    ED Treatments / Results  Labs (all labs ordered are listed, but only abnormal results are displayed) Labs Reviewed - No data to display  EKG None  Radiology No results found.  Procedures Procedures (including critical care time)  Medications Ordered in ED Medications - No data to display   Initial Impression / Assessment and Plan / ED Course  I have reviewed the triage vital signs and the nursing notes.  Pertinent labs & imaging results that were available during my care of the patient were reviewed by me and considered in my medical decision making (see chart for details).    Hayley Romero presents today questioning a medication refill for her eczema cream.  She was seen in the end of April here for this same thing, reports that it is unchanged and that she just needs a refill on her medications.  Will refill her Kenalog cream.  Chart reviewed from her previous ED visit.  Given reported biopsy confirmed eczema I feel like this is appropriate continue treatment.  The importance of following up with dermatologist was stressed to patient states her understanding.  No obvious secondary infection present.  Patient discharged home.  Final Clinical Impressions(s) / ED Diagnoses   Final diagnoses:  Rash    ED Discharge Orders        Ordered    triamcinolone cream (KENALOG) 0.1 %  2 times daily     07/27/17 0741       Cristina GongHammond, Federico Maiorino W, PA-C 07/27/17 0747    Abelino DerrickMackuen, Courteney Lyn, MD 07/27/17 [phone]

## 2017-07-27 NOTE — Discharge Instructions (Signed)
Please make sure with in the first few minutes of being out of the shower you are putting on lotion or other cream.

## 2017-07-27 NOTE — ED Triage Notes (Signed)
Pt stated "I went to my dermatologist in the last month, was given cream but now he won't give me anymore unless I see him.  I have a new appointment with a new dermatologist here in [place].  I have eczema on my chest and hands."

## 2017-11-02 IMAGING — US US PELVIS COMPLETE
1 series · 13 of 25 positions shown · non-contrast
Comparison: None.

CLINICAL DATA: Vaginal bleeding for 1 month

EXAM:
TRANSABDOMINAL ULTRASOUND OF PELVIS
TECHNIQUE: Transabdominal ultrasound examination of the pelvis was performed
including evaluation of the uterus, ovaries, adnexal regions, and
pelvic cul-de-sac. Patient refused transvaginal study. Patient only
able to fill bladder to a limited extent with oral water
consumption.

[Series 1: us pelvis complete · 0.28mm/px · 13 of 32 slices shown]
[im 1/32]
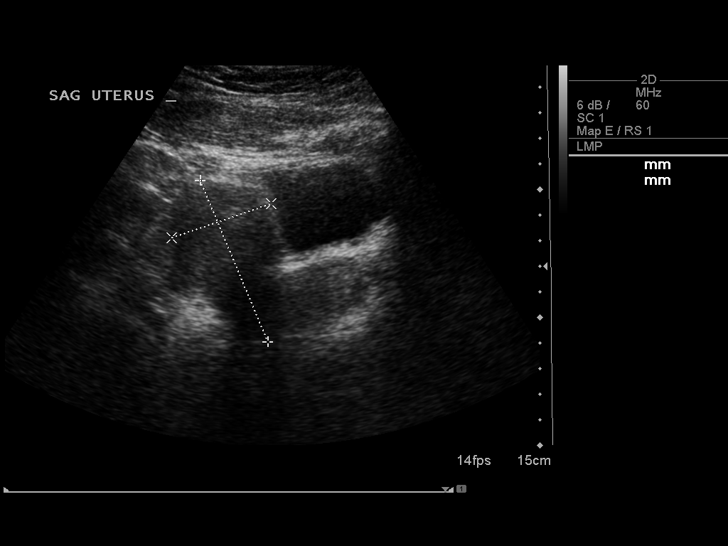
[im 3/32]
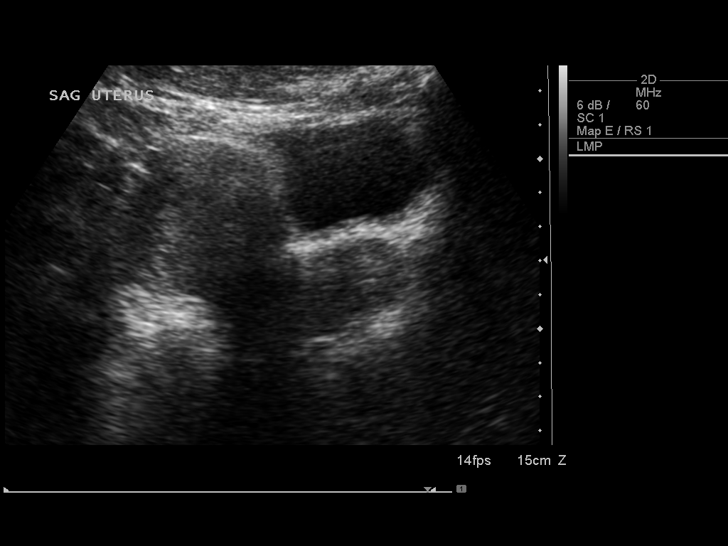
[im 6/32]
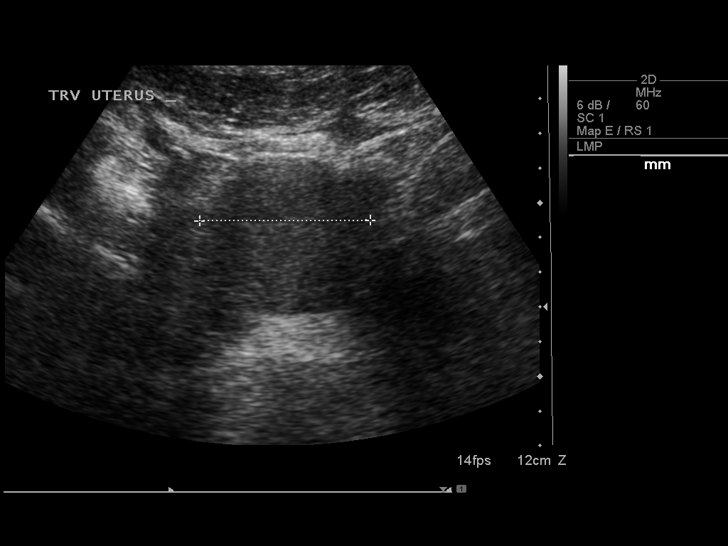
[im 8/32]
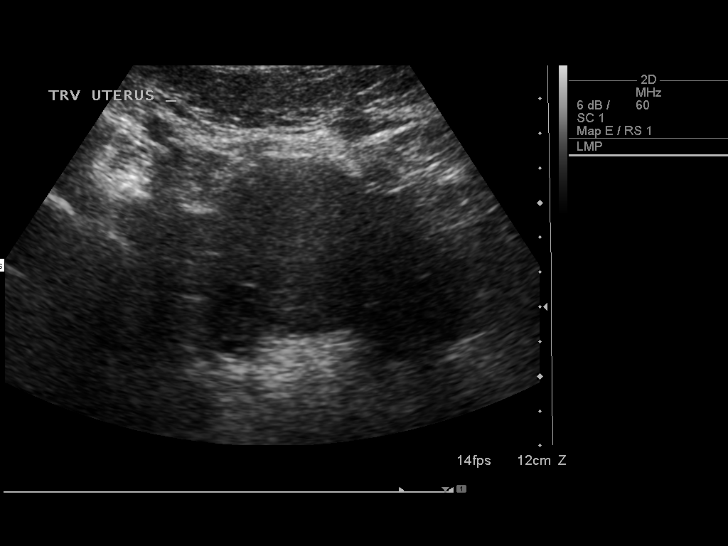
[im 11/32]
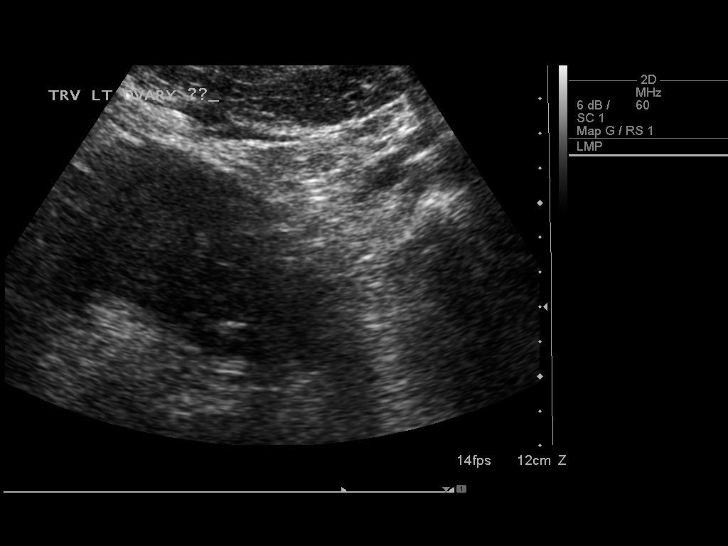
[im 13/32]
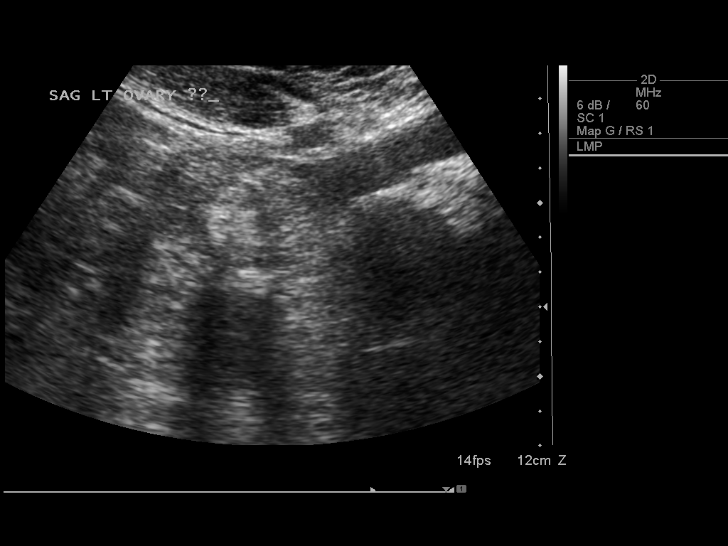
[im 16/32]
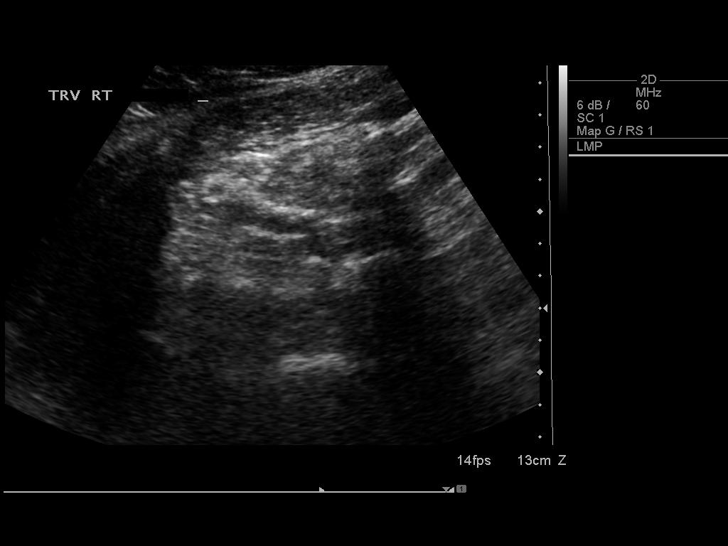
[im 19/32]
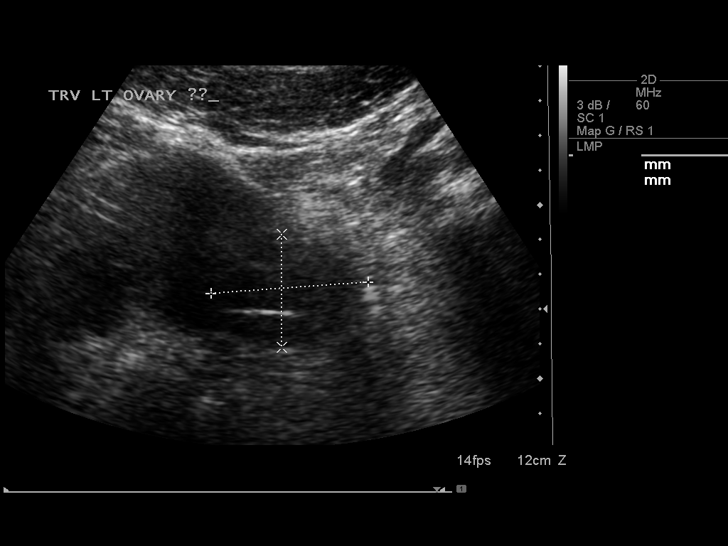
[im 21/32]
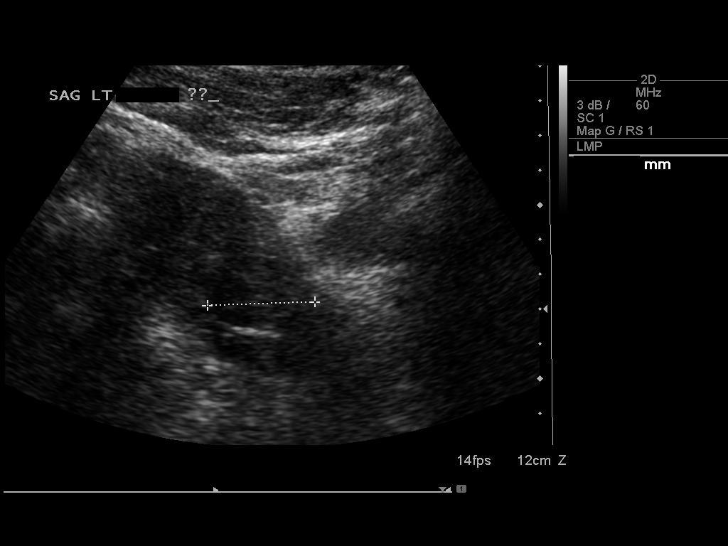
[im 24/32]
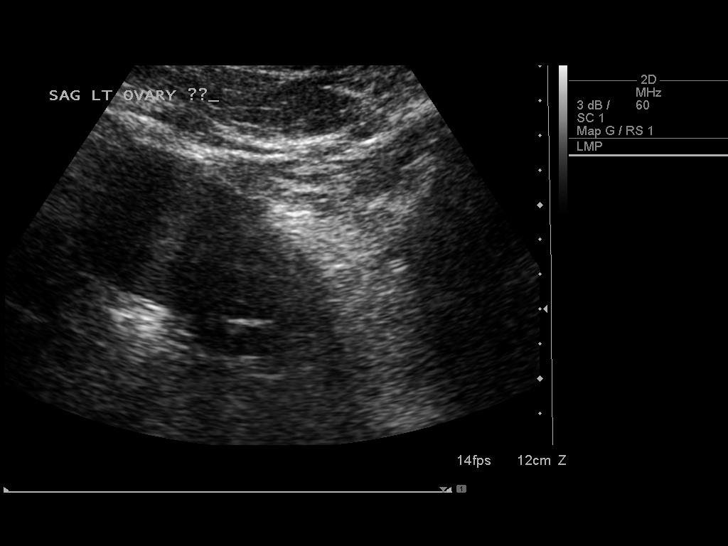
[im 26/32]
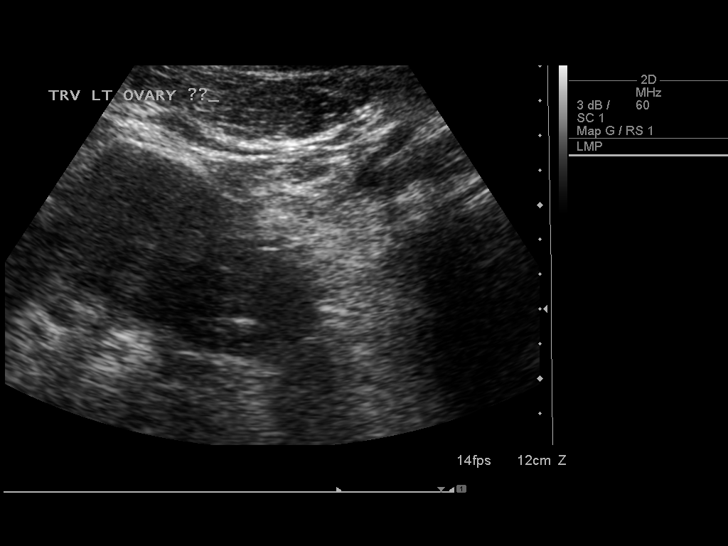
[im 29/32]
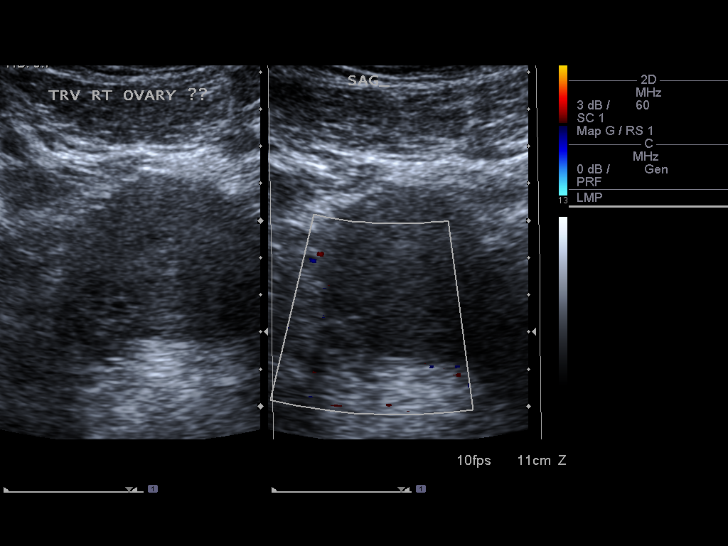
[im 32/32]
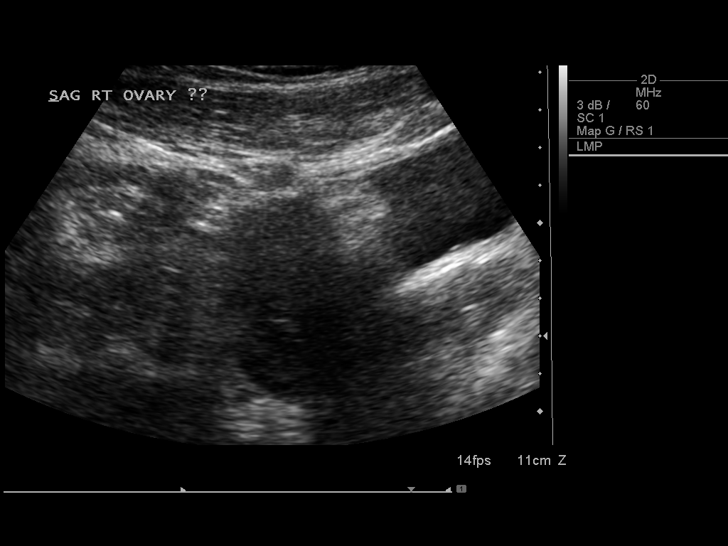

[13 of 25 positions shown; findings below may reference images not displayed]

FINDINGS: Uterus

Measurements: 6.7 x 4.1 x 4.9 cm. No fibroids or other mass
visualized.

Endometrium

Thickness: Not well seen and difficult to measure. No overt
endometrial thickness evident. No focal abnormality visualized.

Right ovary

Measurements: 2.4 x 2.0 x 2.3 cm. Normal appearance/no adnexal mass.

Left ovary

Measurements: 3.1 x 3.6 x 4.5 cm. Septated cystic area in the left
ovary measured 2.4 x 2.3 x 1.6 cm.

Other findings:  No abnormal free fluid.
IMPRESSION: Probable hemorrhagic cyst left ovary. No other extrauterine pelvic
mass. No intrauterine mass identified. Note that endometrium does
not appear overtly thickened but is not well seen on this
transabdominal only study with limited urinary bladder filling. If
there remains concern for potential endometrial pathology, would
advise trans abdominal ultrasound examination. If patient continues
to refuse transvaginal imaging study, pelvic MR would be an
alternative method for endometrial assessment. Short-interval follow
up ultrasound in 6-12 weeks is recommended, preferably during the
week following the patient's normal menses with respect to the
probable hemorrhagic ovarian cyst.

## 2018-01-06 IMAGING — CR DG KNEE COMPLETE 4+V*R*
4 series · 4 of 4 positions shown · non-contrast
Comparison: Right knee radiographs 08/31/2015.

CLINICAL DATA: New onset right-sided knee pain after cleaning
bathroom today.

EXAM:
RIGHT KNEE - COMPLETE 4+ VIEW

[t knee ap right]
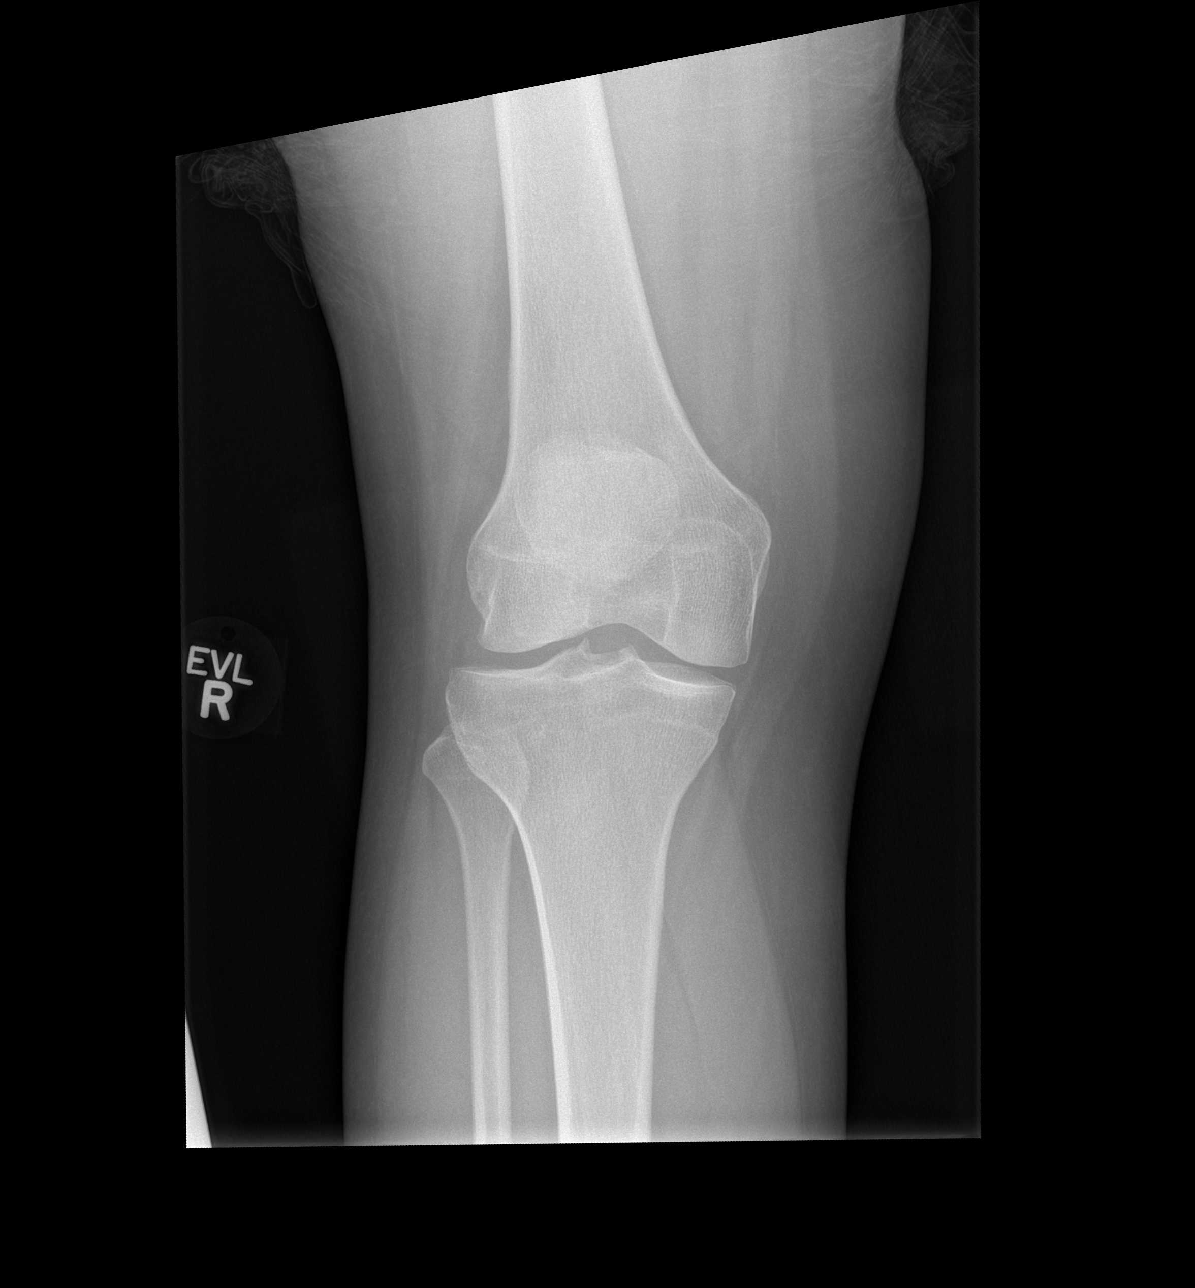

[x knee ap right (1 of 2)]
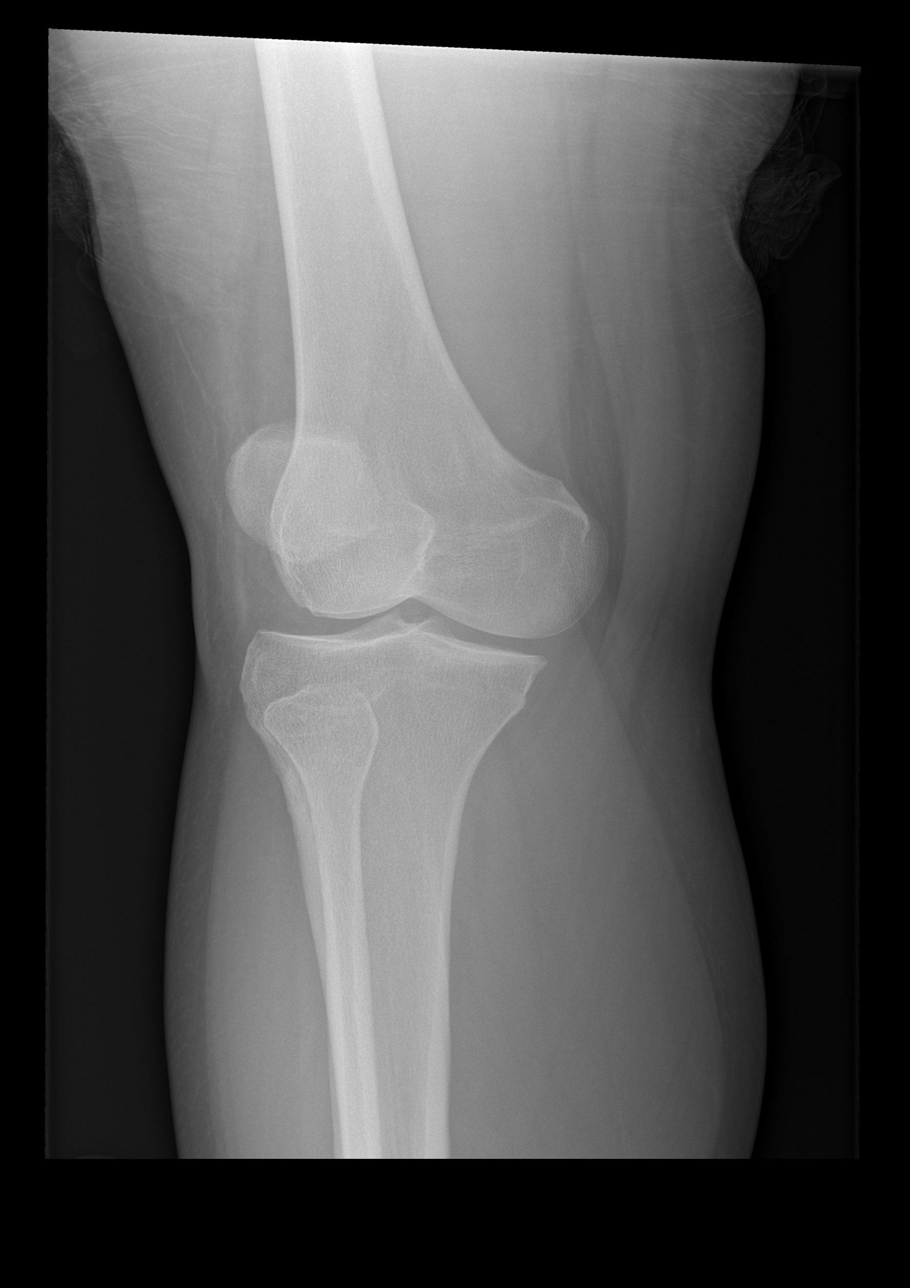

[x knee ap right (2 of 2)]
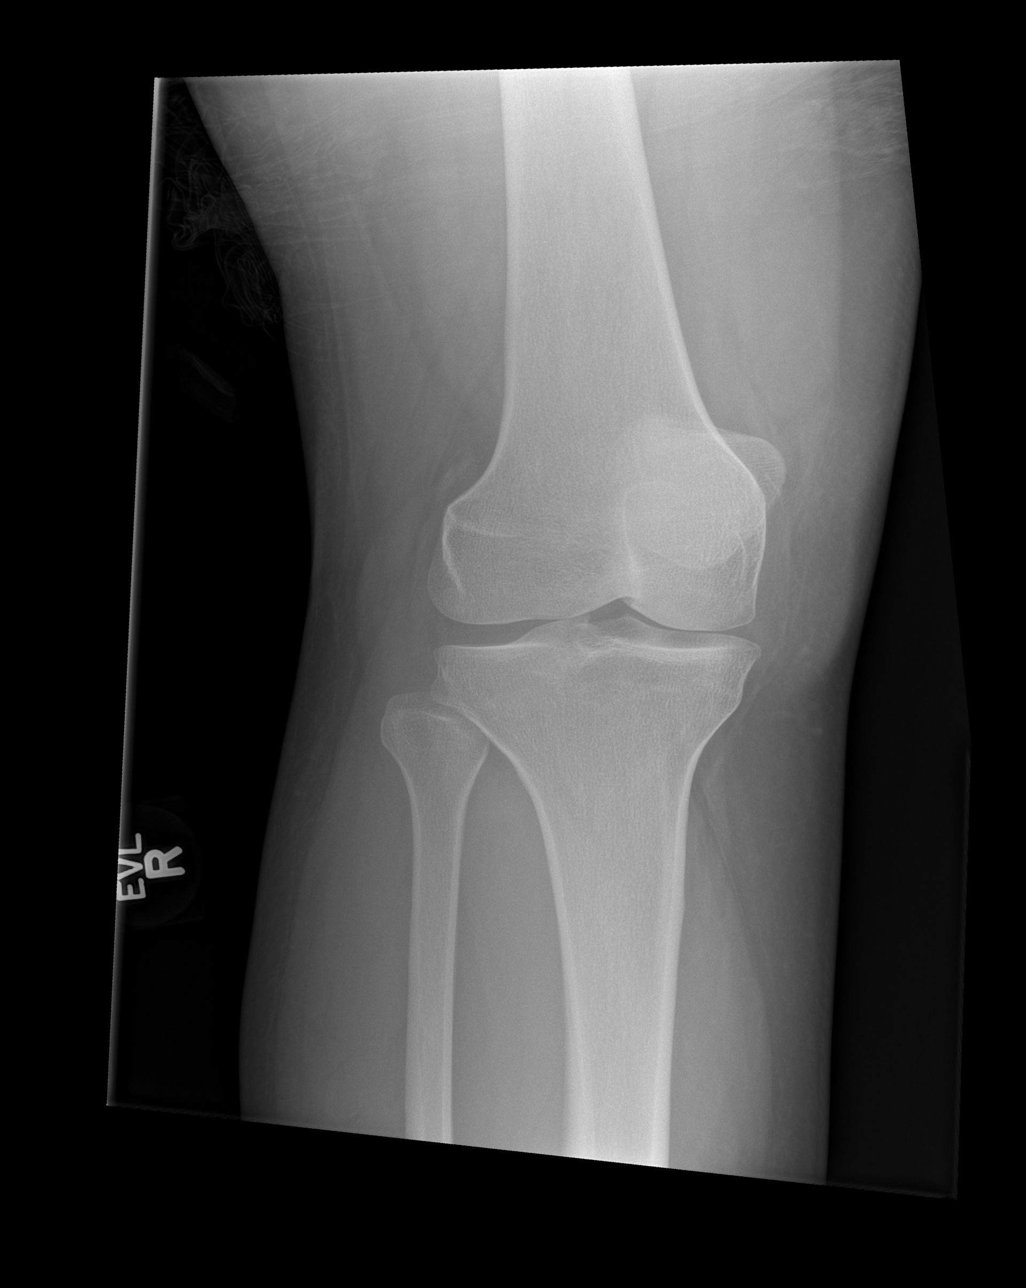

[x knee lat right]
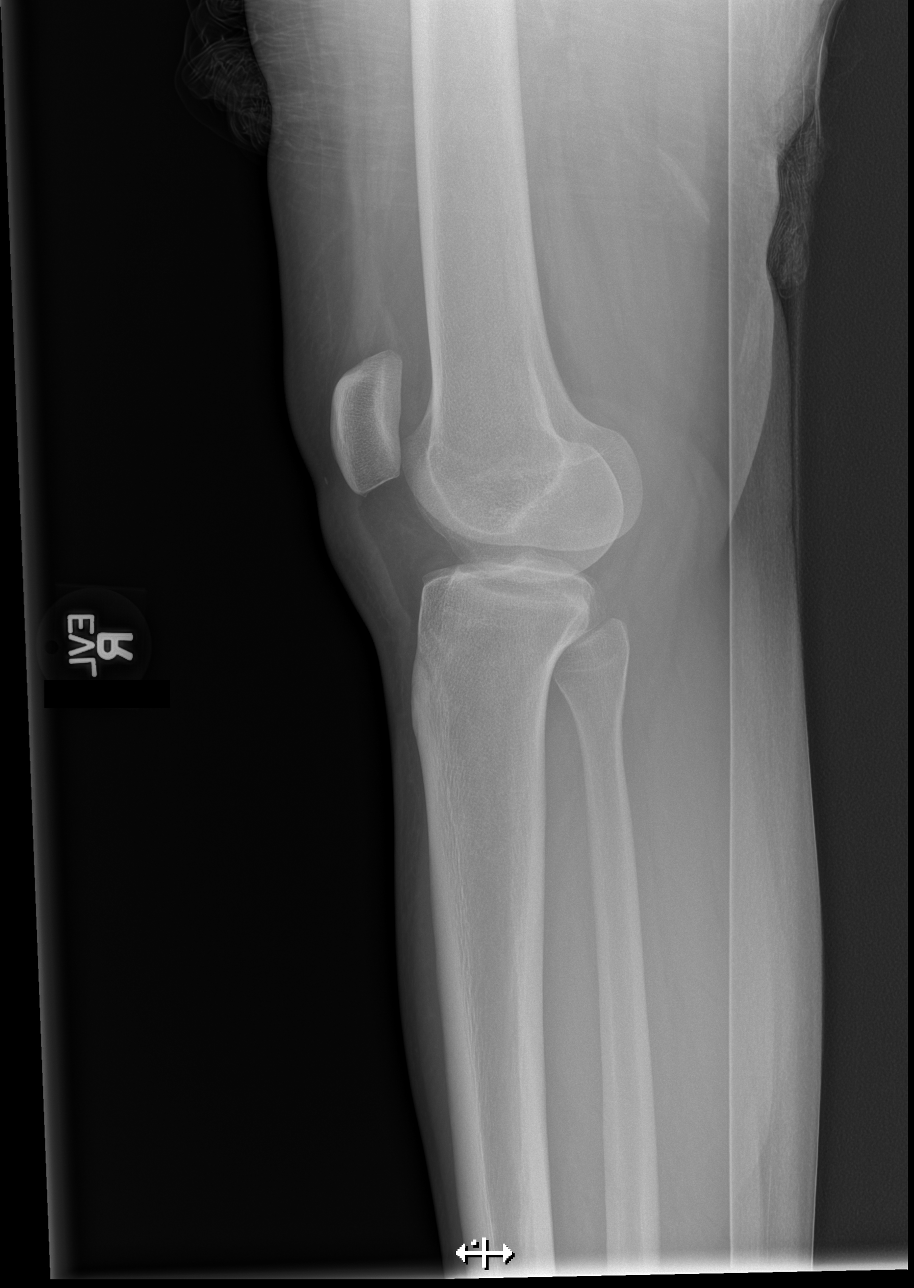

[4 of 4 positions shown; findings below may reference images not displayed]

FINDINGS: No evidence of fracture, dislocation, or joint effusion. No evidence
of arthropathy or other focal bone abnormality. Soft tissues are
unremarkable.
IMPRESSION: Negative right knee radiographs.

## 2018-03-26 ENCOUNTER — Encounter (HOSPITAL_BASED_OUTPATIENT_CLINIC_OR_DEPARTMENT_OTHER): Payer: Self-pay

## 2018-03-26 DIAGNOSIS — R0683 Snoring: Secondary | ICD-10-CM

## 2018-03-26 DIAGNOSIS — R5383 Other fatigue: Secondary | ICD-10-CM

## 2018-03-26 DIAGNOSIS — G4710 Hypersomnia, unspecified: Secondary | ICD-10-CM

## 2018-03-26 DIAGNOSIS — G4700 Insomnia, unspecified: Secondary | ICD-10-CM

## 2018-04-28 ENCOUNTER — Ambulatory Visit (HOSPITAL_BASED_OUTPATIENT_CLINIC_OR_DEPARTMENT_OTHER): Payer: Medicaid Other | Attending: Internal Medicine | Admitting: Internal Medicine

## 2018-04-28 DIAGNOSIS — R0683 Snoring: Secondary | ICD-10-CM | POA: Insufficient documentation

## 2018-04-28 DIAGNOSIS — G4710 Hypersomnia, unspecified: Secondary | ICD-10-CM | POA: Insufficient documentation

## 2018-04-28 DIAGNOSIS — G4700 Insomnia, unspecified: Secondary | ICD-10-CM | POA: Diagnosis not present

## 2018-04-28 DIAGNOSIS — R5383 Other fatigue: Secondary | ICD-10-CM | POA: Diagnosis not present

## 2018-05-01 NOTE — Procedures (Signed)
   NAME: Hayley Romero DATE OF BIRTH:  Jan 01, 1986 MEDICAL RECORD NUMBER [id number]  LOCATION: [facility]  PHYSICIAN: Deretha Emory  DATE OF STUDY: 04/28/2018  SLEEP STUDY TYPE: Nocturnal Polysomnogram               REFERRING PHYSICIAN: Deretha Emory, MD  INDICATION FOR STUDY: excessive daytime sleepiness in patient with possible advance sleep phase and bipolar disorder.   EPWORTH SLEEPINESS SCORE:   HEIGHT: 5' (152.4 cm)  WEIGHT: 186 lb (84.4 kg)    Body mass index is 36.33 kg/m.  NECK SIZE: 15 in.  MEDICATIONS  Patient self administered medications include: KLONOPIN, LAMICITAL, BENEDRYL. Medications administered during study include Sleep medicine administered - BENEDRYL AND KLONOPIN at 09:47:21 PM.   SLEEP STUDY TECHNIQUE  A multi-channel overnight Polysomnography study was performed. The channels recorded and monitored were central and occipital EEG, electrooculogram (EOG), submentalis EMG (chin), nasal and oral airflow, thoracic and abdominal wall motion, anterior tibialis EMG, snore microphone, electrocardiogram, and a pulse oximetry.   TECHNICAL COMMENTS  Comments added by Technician: NONE Comments added by Scorer: N/A   SLEEP ARCHITECTURE  The study was initiated at 9:28:10 PM and terminated at 3:34:38 AM. The total recorded time was 366.5 minutes. EEG confirmed total sleep time was 282 minutes yielding a sleep efficiency of 77.0%%. Sleep onset after lights out was 23.9 minutes with a REM latency of 224.0 minutes. The patient spent 1.4%% of the night in stage N1 sleep, 84.8%% in stage N2 sleep, 0.0%% in stage N3 and 13.8% in REM. Wake after sleep onset (WASO) was 60.6 minutes. The Arousal Index was 3.6/hour.   RESPIRATORY PARAMETERS  There were a total of 3 respiratory disturbances out of which 0 were apneas ( 0 obstructive, 0 mixed, 0 central) and 3 hypopneas. The apnea/hypopnea index (AHI) was 0.6 events/hour. The central sleep apnea index was  0.0 events/hour. The REM AHI was 4.6 events/hour and NREM AHI was 0.0 events/hour. The supine AHI was 0.6 events/hour and the non supine AHI was 0/hr. Respiratory disturbances were associated with oxygen desaturation down to a nadir of 90.0% during sleep. The mean oxygen saturation during the study was 95.5%. The cumulative time under 88% oxygen saturation was 5.5 minutes.  LEG MOVEMENT DATA  The total leg movements were 0 with a resulting leg movement index of 0.0/hr . Associated arousal with leg movement index was 0.0/hr.   CARDIAC DATA  The underlying cardiac rhythm was most consistent with sinus rhythm. Mean heart rate during sleep was 72.3 bpm. Additional rhythm abnormalities include None.   IMPRESSIONS  No Significant Obstructive Sleep apnea(OSA)  Early sleep onset time and arise time consistent with patient's known sleep pattern at home  DIAGNOSIS  Excessive daytime sleepiness  RECOMMENDATIONS  Return to office for further discussions.   Deretha Emory Sleep specialist, American Board of Internal Medicine  ELECTRONICALLY SIGNED ON:  05/01/2018, 8:50 PM [facility] PH: [phone]   FX: [phone] ACCREDITED BY THE AMERICAN ACADEMY OF SLEEP MEDICINE

## 2018-05-14 ENCOUNTER — Encounter (HOSPITAL_BASED_OUTPATIENT_CLINIC_OR_DEPARTMENT_OTHER): Payer: Managed Care, Other (non HMO)
